# Patient Record
Sex: Female | Born: 2000 | Race: Black or African American | Hispanic: No | Marital: Single | Smoking: Never smoker
Health system: Southern US, Community
[De-identification: ages and names within clinical notes are randomized; demographics above are authoritative.]

## PROBLEM LIST (undated history)

## (undated) DIAGNOSIS — F419 Anxiety disorder, unspecified: Secondary | ICD-10-CM

## (undated) DIAGNOSIS — R519 Headache, unspecified: Secondary | ICD-10-CM

## (undated) DIAGNOSIS — J45909 Unspecified asthma, uncomplicated: Secondary | ICD-10-CM

## (undated) DIAGNOSIS — K649 Unspecified hemorrhoids: Secondary | ICD-10-CM

## (undated) HISTORY — DX: Unspecified hemorrhoids: K64.9

## (undated) HISTORY — DX: Unspecified asthma, uncomplicated: J45.909

---
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
## 2019-01-13 ENCOUNTER — Encounter: Payer: BC Managed Care – PPO | Admitting: Obstetrics and Gynecology

## 2019-01-14 ENCOUNTER — Telehealth: Payer: Self-pay

## 2019-01-14 NOTE — Telephone Encounter (Signed)
tching vaginal maybe from wearing pad every day for three months due to non stop bleeding since receiving nexplanon 09/2018. Given appt 12/23 at 11:00 [place]. Please advise if yeast meds refill for itching or recommendations.

## 2019-01-15 ENCOUNTER — Telehealth: Payer: Self-pay

## 2019-01-15 ENCOUNTER — Other Ambulatory Visit: Payer: Self-pay | Admitting: Certified Nurse Midwife

## 2019-01-15 MED ORDER — FLUCONAZOLE 150 MG PO TABS: 150.0000 mg | ORAL_TABLET | Freq: Once | ORAL | 1 refills | Status: DC

## 2019-01-15 MED ORDER — METRONIDAZOLE 500 MG PO TABS: 500.0000 mg | ORAL_TABLET | Freq: Two times a day (BID) | ORAL | 0 refills | Status: AC

## 2019-01-15 MED ORDER — METRONIDAZOLE 500 MG PO TABS: 500.0000 mg | ORAL_TABLET | Freq: Two times a day (BID) | ORAL | 0 refills | Status: DC

## 2019-01-15 MED ORDER — FLUCONAZOLE 150 MG PO TABS: 150.0000 mg | ORAL_TABLET | Freq: Once | ORAL | 1 refills | Status: AC

## 2019-01-15 NOTE — Telephone Encounter (Signed)
She does not need an appointment for the itching? I will send her in meds for BV and yeast. Very common to get vaginal infection with bleeding. I will put order in . Please let her know    Thanks  Deneise Lever

## 2019-01-15 NOTE — Telephone Encounter (Signed)
Informed patient of refill sent by AT. Patient states she moved to [place] and please send to [facility] on [address] which was done

## 2019-01-15 NOTE — Progress Notes (Signed)
Orders placed for BV and yeast. Pt has has bleeding for a few months with nexplanon placement and is experiencing itching/burning.   Philip Aspen, CNM

## 2019-02-03 ENCOUNTER — Encounter: Payer: Self-pay | Admitting: Certified Nurse Midwife

## 2019-02-03 ENCOUNTER — Telehealth: Payer: Self-pay | Admitting: Certified Nurse Midwife

## 2019-02-03 ENCOUNTER — Encounter: Payer: BC Managed Care – PPO | Admitting: Certified Nurse Midwife

## 2019-02-03 ENCOUNTER — Ambulatory Visit: Payer: BC Managed Care – PPO | Admitting: Certified Nurse Midwife

## 2019-02-03 ENCOUNTER — Other Ambulatory Visit: Payer: Self-pay

## 2019-02-03 VITALS — BP 114/66 | HR 64 | Ht 69.0 in | Wt 123.3 lb

## 2019-02-03 DIAGNOSIS — N939 Abnormal uterine and vaginal bleeding, unspecified: Secondary | ICD-10-CM

## 2019-02-03 MED ORDER — TRANEXAMIC ACID 650 MG PO TABS: 1300.0000 mg | ORAL_TABLET | Freq: Three times a day (TID) | ORAL | 0 refills | Status: AC

## 2019-02-03 NOTE — Progress Notes (Signed)
GYN ENCOUNTER NOTE  Subjective:       Shelley Willis is a 18 y.o. G0P0000 female is here for gynecologic evaluation of the following issues:  1. Bleeding since September, She has the nexplanon for birth control. She has not tried motrin.    Gynecologic History No LMP recorded. Patient has had an implant. Contraception: Nexplanon Last Pap: N/A  mammogram: N/A  Obstetric History OB History  Gravida Para Term Preterm AB Living  0 0 0 0 0 0  SAB TAB Ectopic Multiple Live Births  0 0 0 0 0    Past Medical History:  Diagnosis Date  . Asthma     History reviewed. No pertinent surgical history.  Current Outpatient Medications on File Prior to Visit  Medication Sig Dispense Refill  . acetaminophen (TYLENOL) 325 MG tablet Take 325 mg by mouth as needed.     . Acetaminophen-Caff-Pyrilamine (MIDOL MAX ST MENSTRUAL) 500-60-15 MG TABS Take 2 tablets by mouth as needed.    . cetirizine (ZYRTEC) 10 MG tablet Take 10 mg by mouth as needed for allergies.     No current facility-administered medications on file prior to visit.    No Known Allergies  Social History   Socioeconomic History  . Marital status: Single    Spouse name: Not on file  . Number of children: Not on file  . Years of education: Not on file  . Highest education level: Not on file  Occupational History  . Not on file  Tobacco Use  . Smoking status: Never Smoker  . Smokeless tobacco: Never Used  Substance and Sexual Activity  . Alcohol use: Yes    Comment: occasional   . Drug use: Yes    Types: Marijuana  . Sexual activity: Yes    Birth control/protection: Condom  Other Topics Concern  . Not on file  Social History Narrative  . Not on file   Social Determinants of Health   Financial Resource Strain:   . Difficulty of Paying Living Expenses: Not on file  Food Insecurity:   . Worried About Charity fundraiser in the Last Year: Not on file  . Ran Out of Food in the Last Year: Not on file   Transportation Needs:   . Lack of Transportation (Medical): Not on file  . Lack of Transportation (Non-Medical): Not on file  Physical Activity:   . Days of Exercise per Week: Not on file  . Minutes of Exercise per Session: Not on file  Stress:   . Feeling of Stress : Not on file  Social Connections:   . Frequency of Communication with Friends and Family: Not on file  . Frequency of Social Gatherings with Friends and Family: Not on file  . Attends Religious Services: Not on file  . Active Member of Clubs or Organizations: Not on file  . Attends Archivist Meetings: Not on file  . Marital Status: Not on file  Intimate Partner Violence:   . Fear of Current or Ex-Partner: Not on file  . Emotionally Abused: Not on file  . Physically Abused: Not on file  . Sexually Abused: Not on file    Family History  Problem Relation Age of Onset  . Breast cancer Neg Hx   . Ovarian cancer Neg Hx   . Colon cancer Neg Hx     The following portions of the patient's history were reviewed and updated as appropriate: allergies, current medications, past family history, past medical history, past social  history, past surgical history and problem list.  Review of Systems Review of Systems - Negative except as mentioned in HPI Review of Systems - General ROS: negative for - chills, fatigue, fever, hot flashes, malaise or night sweats Hematological and Lymphatic ROS: negative for - bleeding problems or swollen lymph nodes Gastrointestinal ROS: negative for - abdominal pain, blood in stools, change in bowel habits and nausea/vomiting Musculoskeletal ROS: negative for - joint pain, muscle pain or muscular weakness Genito-Urinary ROS: negative for - , dysmenorrhea, dyspareunia, dysuria, genital discharge, genital ulcers, hematuria, incontinence, irregular/heavy menses, nocturia or pelvic pain. Positive for menstrual cycle change  Objective:   BP 114/66   Pulse 64   Ht 5\' 9"  (1.753 m)   Wt 123  lb 5 oz (55.9 kg)   BMI 18.21 kg/m  CONSTITUTIONAL: Well-developed, well-nourished female in no acute distress.  HENT:  Normocephalic, atraumatic.  NECK: Normal range of motion, supple, no masses.  Normal thyroid.  SKIN: Skin is warm and dry. No rash noted. Not diaphoretic. No erythema. No pallor. [place]: Alert and oriented to person, place, and time. PSYCHIATRIC: Normal mood and affect. Normal behavior. Normal judgment and thought content. CARDIOVASCULAR:Not Examined RESPIRATORY: Not Examined BREASTS: Not Examined ABDOMEN: Soft, non distended; Non tender.  No Organomegaly. PELVIC:not indicated  MUSCULOSKELETAL: Normal range of motion. No tenderness.  No cyanosis, clubbing, or edema.  Assessment:   Abnormal uterine bleeding with Nexplanon   Plan:  Reviewed bleeding profile with Nexplanon, discussed use of motrin to thin lining of uterus and help bleeding, discussed use on pill x 3 months, or use of Lysteda . Pt request to use lysteda. She denies any contraindications to use. Orders placed.   I attest more than 50% of visit spent reviewing history, discussing plan of care , answering questions. Face to face time 10 min.  Philip Aspen, CNM

## 2019-02-11 NOTE — Telephone Encounter (Signed)
Pt was advised to try taking the medication with applesauce or yogurt. Pt stated that she has a really hard time swallowing pills and the pill dissolved before she get the medication down. Pt was advised to try to cut the medication in half and take 2 halves instead of one whole pill to see if that would work. Pt stated that she would try that.

## 2019-02-11 NOTE — Telephone Encounter (Signed)
Pt called in stated that the pills that was given to her at her last appt are hard to swallow  and she is letting the desovle. Pt is wanting to know is that okay or cant she have something smaller.

## 2019-02-15 ENCOUNTER — Telehealth: Payer: Self-pay | Admitting: Certified Nurse Midwife

## 2019-02-15 NOTE — Telephone Encounter (Signed)
Pt called in stating that she has stopped taking the pills that stop  Bleeding. Pt is requesting a different pill to take. please advise

## 2019-02-16 ENCOUNTER — Telehealth: Payer: Self-pay

## 2019-02-16 NOTE — Telephone Encounter (Signed)
Spoke with patient to get more information about the message that was received from her. Patient states she only took 1 Lysteda because she can't swallow pills despite trying yogurt,ice cream and applesauce. She states the pill dissolves in her mouth before she can get it down. She does not wish to try the OCPs  For the same reason.

## 2019-02-16 NOTE — Telephone Encounter (Signed)
Ivin Booty can you please follow up with this pt. She has in a nexplanon. From my last note it looks like she tried the lysteda. She called stating "that she has stopped taking the pills that stop bleeding , she is requesting a different pill ."  So need to clarify if she took lysteda did it stop the bleeding? If not we talked about trying BC pill x 3 months or does she want to change BC from nexplanon to pill?  Thanks  Deneise Lever

## 2019-02-17 ENCOUNTER — Telehealth: Payer: Self-pay

## 2019-02-17 NOTE — Telephone Encounter (Signed)
Patient missed the providers phone call. Would like a phone call. Patient says vmail was cut off

## 2019-02-17 NOTE — Telephone Encounter (Signed)
Voicemail message left for patient- per AT she may wait it out with the Nexplanon, or change to an IUD, a NuvaRing or the depo inj. Requested patient let us know what she decides to do.

## 2019-02-18 ENCOUNTER — Telehealth: Payer: Self-pay

## 2019-02-18 NOTE — Telephone Encounter (Signed)
Spoke with patient to give her her options for different birth control. After she was given the options she was asked if any of them sounded like something she was interested in. She chuckled and said that's not why I called. I reminded her of our conversation about her not being able to swallow pills. She wanted to know what the other pill was AT mentioned. She said she thought it was motrin. That is an option she can try to stop the bleeding which she said was not bad today. She was encouraged to reach back out to [place] if she had any further questions.

## 2019-03-04 ENCOUNTER — Other Ambulatory Visit: Payer: Self-pay | Admitting: Certified Nurse Midwife

## 2019-03-04 DIAGNOSIS — N6320 Unspecified lump in the left breast, unspecified quadrant: Secondary | ICD-10-CM

## 2019-03-09 ENCOUNTER — Other Ambulatory Visit: Payer: Self-pay

## 2019-03-09 DIAGNOSIS — N63 Unspecified lump in unspecified breast: Secondary | ICD-10-CM

## 2019-03-16 ENCOUNTER — Other Ambulatory Visit: Payer: Self-pay | Admitting: Obstetrics and Gynecology

## 2019-03-16 DIAGNOSIS — N63 Unspecified lump in unspecified breast: Secondary | ICD-10-CM

## 2019-03-18 ENCOUNTER — Telehealth: Payer: Self-pay

## 2019-03-18 NOTE — Telephone Encounter (Signed)
Patient mom is upset that her daughters referral to the breast center isn't complete. Breast ctr says there are no active orders showing in the system. Please call the [facility]

## 2019-03-20 NOTE — Telephone Encounter (Signed)
Ok. Will do

## 2019-03-24 NOTE — Telephone Encounter (Signed)
Called patient to make her aware that order has been E-signed.  No answer, LMTRC.

## 2019-04-01 ENCOUNTER — Encounter: Payer: BC Managed Care – PPO | Admitting: Certified Nurse Midwife

## 2019-04-16 ENCOUNTER — Ambulatory Visit
Admission: RE | Admit: 2019-04-16 | Discharge: 2019-04-16 | Disposition: A | Discharge status: Home / Self Care | Payer: BC Managed Care – PPO | Source: Ambulatory Visit | Attending: Obstetrics and Gynecology | Admitting: Obstetrics and Gynecology

## 2019-04-16 DIAGNOSIS — N63 Unspecified lump in unspecified breast: Secondary | ICD-10-CM | POA: Insufficient documentation

## 2019-04-18 NOTE — Progress Notes (Signed)
For some reason, you are listed as her PCP. JML

## 2019-04-19 ENCOUNTER — Other Ambulatory Visit: Payer: Self-pay | Admitting: Obstetrics and Gynecology

## 2019-04-19 ENCOUNTER — Telehealth: Payer: Self-pay | Admitting: Certified Nurse Midwife

## 2019-04-19 DIAGNOSIS — N6320 Unspecified lump in the left breast, unspecified quadrant: Secondary | ICD-10-CM

## 2019-04-19 DIAGNOSIS — R928 Other abnormal and inconclusive findings on diagnostic imaging of breast: Secondary | ICD-10-CM

## 2019-04-19 NOTE — Telephone Encounter (Signed)
Mother requesting 04/16/2019 breast imaging results for patient and wondering what will be next?  Please advise and let me know if I need to do anything.  Thanks. Jennye Moccasin

## 2019-04-19 NOTE — Telephone Encounter (Signed)
Check and make sure mother is able to obtain patient information. Report is below. Follow up as indicated. Activate MyChart for direct line to information. JML   CLINICAL DATA:  Short-term interval follow-up of a left breast mass. The patient states that the left breast mass is clinically enlarging.  EXAM: ULTRASOUND OF THE LEFT BREAST  COMPARISON:  Previous exam(s).  FINDINGS: On physical exam, I palpate a discrete mass in left breast at 1 o'clock 2 cm from the nipple.  Targeted ultrasound is performed, showing a well-circumscribed hypoechoic mass in the left breast at 1 o'clock 2 cm from the nipple measuring 3.5 x 1.8 x 3.8 cm. On the prior ultrasound dated 10/08/2018 it measured 2.8 x 1.5 x 2.8 cm. Sonographic evaluation the left axilla does not show any enlarged adenopathy.  IMPRESSION: Indeterminate left breast mass.  RECOMMENDATION: Ultrasound-guided core biopsy of the mass in the 1 o'clock region of the left breast is recommended.  I have discussed the findings and recommendations with the patient. If applicable, a reminder letter will be sent to the patient regarding the next appointment.  BI-RADS CATEGORY  4: Suspicious.

## 2019-04-19 NOTE — Telephone Encounter (Signed)
Patients mother called wanting to follow up with her daughters test results. Wants to touch base on the next steps.

## 2019-04-20 NOTE — Telephone Encounter (Signed)
Attempted to contact patient.  No answer, LMTRC.

## 2019-04-21 NOTE — Telephone Encounter (Signed)
Attempted to contact patient.  No answer, LMTRC.

## 2019-04-23 ENCOUNTER — Telehealth: Payer: Self-pay | Admitting: Obstetrics and Gynecology

## 2019-04-23 NOTE — Telephone Encounter (Signed)
Pt was trying to talk to a nurse. The pt is requesting a call back. Please advise

## 2019-04-23 NOTE — Telephone Encounter (Signed)
Patient called about her biopsy. Wants to get it removed. Told patient to call back in 10-20 minutes when nurse gets back from lunch.

## 2019-04-23 NOTE — Telephone Encounter (Signed)
Attempted to contact patient x3.  Chart filed.

## 2019-04-23 NOTE — Telephone Encounter (Signed)
Called and spoke with patient.  Patient requesting referral to breast surgeon.  Please advise.

## 2019-04-24 ENCOUNTER — Other Ambulatory Visit (INDEPENDENT_AMBULATORY_CARE_PROVIDER_SITE_OTHER): Payer: BC Managed Care – PPO | Admitting: Certified Nurse Midwife

## 2019-04-24 DIAGNOSIS — N6321 Unspecified lump in the left breast, upper outer quadrant: Secondary | ICD-10-CM

## 2019-04-24 NOTE — Progress Notes (Signed)
Referral to General Surgery per patient request.    Shelley Willis, CNM [facility], [facility] 04/24/19 2:57 PM

## 2019-04-24 NOTE — Telephone Encounter (Signed)
Please contact patient. Referral placed to general surgery. Encouraged patient to activate MyChart. Thanks, JML

## 2019-04-28 NOTE — Telephone Encounter (Signed)
Called patient, no answer.  [place].

## 2019-04-29 NOTE — Telephone Encounter (Signed)
Called patient, no answer.  [place].

## 2019-04-30 ENCOUNTER — Encounter: Payer: Self-pay | Admitting: Surgery

## 2019-04-30 ENCOUNTER — Ambulatory Visit: Payer: BC Managed Care – PPO | Admitting: Surgery

## 2019-04-30 ENCOUNTER — Other Ambulatory Visit: Payer: Self-pay

## 2019-04-30 VITALS — BP 100/65 | HR 79 | Temp 98.1°F | Resp 14 | Ht 69.0 in | Wt 122.0 lb

## 2019-04-30 DIAGNOSIS — N6321 Unspecified lump in the left breast, upper outer quadrant: Secondary | ICD-10-CM

## 2019-04-30 NOTE — Patient Instructions (Addendum)
We have sent a message to the [facility] letting them know to contact you to schedule your breast biopsy. The will contact you about this. If you do not hear from them by Wednesday please let us know. We will schedule you for a follow up here with Dr Hampton Abbot to discuss the results and further care once we know the date for your biopsy.    Breast Biopsy A breast biopsy is a procedure in which a sample of breast tissue is removed from the breast and examined under a microscope to see if cancerous cells are present. You may need a breast biopsy if you have:  Any undiagnosed breast mass (tumor).  Nipple abnormalities, dimpling, crusting, or ulcerations.  Abnormal discharge from the nipple, especially blood.  Redness, swelling, and pain of the breast.  Calcium deposits (calcifications) or abnormalities seen on a mammogram, ultrasound results, or MRI results.  Abnormal changes in the breast seen on your mammogram. If the breast abnormality is found to be cancerous (malignant), a breast biopsy can help to determine what the best treatment is for you. There are many different types of breast biopsies. Talk with your health care provider about your options and which type is best for you. Tell a health care provider about:  Any allergies you have.  All medicines you are taking, including vitamins, herbs, eye drops, creams, and over-the-counter medicines.  Any problems you or family members have had with anesthetic medicines.  Any blood disorders you have.  Any surgeries you have had.  Any medical conditions you have.  Whether you are pregnant or may be pregnant. What are the risks? Generally, this is a safe procedure. However, problems may occur, including:  Discomfort. This is temporary.  Bruising and swelling of the breast.  Changes in the shape of the breast.  Bleeding.  Infection.  Damage to other tissues.  Allergic reactions to medicines.  Needing more  surgery. What happens before the procedure? Medicines Ask your health care provider about:  Changing or stopping your regular medicines. This is especially important if you are taking diabetes medicines or blood thinners.  Taking medicines such as aspirin and ibuprofen. These medicines can thin your blood. Do not take these medicines unless your health care provider tells you to take them.  Taking over-the-counter medicines, vitamins, herbs, and supplements. Lifestyle  Do not use any products that contain nicotine or tobacco, such as cigarettes, e-cigarettes, and chewing tobacco. If you need help quitting, ask your health care provider.  Do not drink alcohol for 24 hours before the procedure.  Wear a good support bra to the procedure. Eating and drinking restrictions Talk to your health care provider about when you should stop eating and drinking.  You may be asked not to drink or eat for 2-8 hours before the breast biopsy.  In some cases, you may be allowed to eat a light breakfast. General instructions  Plan to have someone take you home from the hospital or clinic.  Ask your health care provider how your surgical site will be marked or identified.  Ask your health care provider what steps will be taken to help prevent infection. These may include: ? Removing hair at the surgery site. ? Washing skin with a germ-killing soap.  Your health care provider may do a procedure to locate and mark the tumor area in your breast (localization). This will help guide your surgeon to where the biopsy or incision is made. This may be done with: ?  Imaging, such as a mammogram, ultrasound, or MRI. ? Insertion of special wire, clip, seed, or radar reflector implant in the tumor area. What happens during the procedure?   You may be given one or more of the following: ? A medicine to numb the breast area (local anesthetic). ? A medicine to help you relax (sedative). ? A medicine to make you  fall asleep (general anesthetic).  Your health care provider will perform the biopsy using only one of the following methods. He or she will do: ? Fine needle aspiration. A thin needle with a syringe will be inserted into a breast cyst. Fluid and cells will be removed. ? Core needle biopsy. A wide, hollow needle (core needle) will be inserted into a breast lump multiple times to remove tissue samples or cores. ? Stereotactic biopsy. X-rays and a computer will be used to locate the breast lump. The surgeon will use the X-ray images to collect several samples of tissue using a needle. ? Vacuum-assisted biopsy. A small incision will be made in your breast. A hollow needle and vacuum will be passed through the incision and into the breast tissue. The vacuum will gently draw abnormal breast tissue into the needle to remove it. ? Ultrasound-guided core needle biopsy. An ultrasound will be used to help guide the core needle to the area of the mass or abnormality. An incision will be made to insert the needle. Then tissue samples will be removed. ? Surgical biopsy. An incision will be made in the breast to remove part or all of the abnormal tissue. After the tissue is removed, the skin over the area will be closed with sutures and covered with a dressing. There are two types of surgical biopsies:  Incisional biopsy. The surgeon will remove part of the breast lump.  Excisional biopsy. The surgeon will attempt to remove the whole breast lump or as much of it as possible. After any of these procedures, the tissue or fluid that was removed will be examined under a microscope. The procedure may vary among health care providers and hospitals. What happens after the procedure?  You will be taken to the recovery area. If you are doing well and have no problems, you will be allowed to go home.  You may have a pressure dressing applied on your breast for 24-48 hours. You may also be advised to wear a supportive bra  during this time.  Do not drive for 24 hours if you were given a sedative during your procedure. Summary  A breast biopsy is a procedure in which a sample of breast tissue is removed from the breast and examined under a microscope to see if cancerous cells are present.  This is a safe procedure, but problems can occur, including bleeding, infection, pain, and bruising.  Ask your health care provider about changing or stopping your regular medicines.  Plan to have someone take you home from the hospital or clinic. This information is not intended to replace advice given to you by your health care provider. Make sure you discuss any questions you have with your health care provider. Document Revised: 07/16/2017 Document Reviewed: 07/16/2017 Elsevier Patient Education  [place].

## 2019-04-30 NOTE — Telephone Encounter (Signed)
Called and spoke with patient.  Patient stated she had an appointment with the surgeon today and is planning on proceeding with breast biopsy.  Encouraged to activate MyChart and patient planning on setting it up.

## 2019-04-30 NOTE — Progress Notes (Signed)
04/30/2019  Reason for Visit:  Left breast mass  Referring Provider:  Rubie Maid, MD  History of Present Illness: Shelley Willis is a 19 y.o. female presenting for evaluation of a left breast mass.  The patient first noticed it about a year ago.  She had an ultrasound on 10/08/18 which showed a hypoechoic oval mass measuring 2.8 x 1.5 x 2.8 cm, and labeled as BIRADS 3 probably benign.  She had a follow up ultrasound on 04/16/19 which showed that the mass had grown in size measuring 3.5 x 1.8 x 3.8 cm.  It was labeled BIRADS 4 suspicious.  Biopsy was recommended.  The patient presents today because she's wondering about proceeding directly with excision instead of biopsy.  Patient reports that she has noticed the mass growing over the past two months or so.  She also notices discomfort on the left breast, particularly if she sleeps on her left side or she palpates the area.  Denies any skin changes or nipple changes.  Also denies any issues on the right breast.  Past Medical History: Past Medical History:  Diagnosis Date  . Asthma   . Hemorrhoids      Past Surgical History: --None  Home Medications: Prior to Admission medications   Medication Sig Start Date End Date Taking? Authorizing Provider  Acetaminophen-Caff-Pyrilamine (MIDOL MAX ST MENSTRUAL) 500-60-15 MG TABS Take 2 tablets by mouth as needed.   Yes [provider]  cetirizine (ZYRTEC) 10 MG tablet Take 10 mg by mouth as needed for allergies.   Yes [provider]  etonogestrel (NEXPLANON) 68 MG IMPL implant 1 each by Subdermal route once.   Yes [provider]    Allergies: No Known Allergies  Social History:  reports that she has never smoked. She has never used smokeless tobacco. She reports current alcohol use. She reports current drug use. Drug: Marijuana.   Family History: Family History  Problem Relation Age of Onset  . Fibrocystic breast disease Mother   . Breast cancer Neg Hx   . Ovarian  cancer Neg Hx   . Colon cancer Neg Hx     Review of Systems: Review of Systems  Constitutional: Negative for chills and fever.  HENT: Negative for hearing loss.   Respiratory: Negative for shortness of breath.   Cardiovascular: Negative for chest pain.  Gastrointestinal: Negative for abdominal pain, nausea and vomiting.  Genitourinary: Negative for dysuria.  Musculoskeletal: Negative for myalgias.  Skin: Negative for rash.  Neurological: Negative for dizziness.  Psychiatric/Behavioral: Negative for depression.    Physical Exam BP 100/65   Pulse 79   Temp 98.1 F (36.7 C)   Resp 14   Ht 5\' 9"  (1.753 m)   Wt 122 lb (55.3 kg)   LMP 04/28/2019   SpO2 98%   BMI 18.02 kg/m  CONSTITUTIONAL: No acute distress HEENT:  Normocephalic, atraumatic, extraocular motion intact. NECK: Trachea is midline, and there is no jugular venous distension.  RESPIRATORY:  Lungs are clear, and breath sounds are equal bilaterally. Normal respiratory effort without pathologic use of accessory muscles. CARDIOVASCULAR: Heart is regular without murmurs, gallops, or rubs. BREAST:  Left breast with a palpable mass at 1-2 o clock position, 2-3 cm from nipple.  It is mobile, well circumscribed, but does have some discomfort with palpation.  Otherwise no other palpable masses, no skin changes, and no nipple changes.  No left axillary or supraclavicular lymphadenopathy.  Right breast without any palpable masses, skin changes, or nipple changes.  No  right axillary or supraclavicular lymphadenopathy. GI: The abdomen is soft, non-distended, non-tender.  MUSCULOSKELETAL:  Normal muscle strength and tone in all four extremities.  No peripheral edema or cyanosis. SKIN: Skin turgor is normal. There are no pathologic skin lesions.  NEUROLOGIC:  Motor and sensation is grossly normal.  Cranial nerves are grossly intact. PSYCH:  Alert and oriented to person, place and time. Affect is normal.  Laboratory Analysis: No results  found for this or any previous visit (from the past 24 hour(s)).  Imaging: U/S 10/08/18: FINDINGS: On physical exam, a palpable lump is felt in the left breast at 1 o'clock.  Targeted ultrasound is performed, showing a mildly hypoechoic oval mass with parallel orientation and increased through transmission measuring 2.8 x 1.5 by 2.8 cm. No axillary adenopathy.  IMPRESSION: Probably benign left breast mass. The patient states the mass has not significantly changed in size by palpation since March 2020.  RECOMMENDATION: Recommend six-month follow-up ultrasound of the probably benign left breast mass.  I have discussed the findings and recommendations with the patient. Results were also provided in writing at the conclusion of the visit. If applicable, a reminder letter will be sent to the patient regarding the next appointment.  BI-RADS CATEGORY  3: Probably benign.   U/S 04/16/19: FINDINGS: On physical exam, I palpate a discrete mass in left breast at 1 o'clock 2 cm from the nipple.  Targeted ultrasound is performed, showing a well-circumscribed hypoechoic mass in the left breast at 1 o'clock 2 cm from the nipple measuring 3.5 x 1.8 x 3.8 cm. On the prior ultrasound dated 10/08/2018 it measured 2.8 x 1.5 x 2.8 cm. Sonographic evaluation the left axilla does not show any enlarged adenopathy.  IMPRESSION: Indeterminate left breast mass.  RECOMMENDATION: Ultrasound-guided core biopsy of the mass in the 1 o'clock region of the left breast is recommended.  I have discussed the findings and recommendations with the patient. If applicable, a reminder letter will be sent to the patient regarding the next appointment.  BI-RADS CATEGORY  4: Suspicious.  Assessment and Plan: This is a 19 y.o. female with an enlarging left breast mass.  Discussed with the patient that given the enlargement of the mass, and now labeling of BIRADS 4, I would agree with obtaining biopsy of  the mass prior to surgical excision.  This would be so that we have a diagnosis prior to surgery.  In the less likely scenario that this is malignant, the surgical plan would change as lymph nodes would need to be sampled.  I think this would be better to all do in one surgery rather than two different surgical settings if we excise the mass only first.  They are in agreement with this plan. Will contact [facility] so they can schedule her biopsy.  She will follow up with me afterwards to discuss results and to schedule surgery.  Face-to-face time spent with the patient and care providers was 60 minutes, with more than 50% of the time spent counseling, educating, and coordinating care of the patient.     Melvyn Neth, [facility]

## 2019-05-04 ENCOUNTER — Other Ambulatory Visit: Payer: Self-pay | Admitting: Surgery

## 2019-05-04 DIAGNOSIS — R928 Other abnormal and inconclusive findings on diagnostic imaging of breast: Secondary | ICD-10-CM

## 2019-05-04 DIAGNOSIS — N6320 Unspecified lump in the left breast, unspecified quadrant: Secondary | ICD-10-CM

## 2019-05-05 ENCOUNTER — Telehealth: Payer: Self-pay

## 2019-05-05 NOTE — Telephone Encounter (Signed)
Message left for the patient to call the office back. She is scheduled for a breast biopsy at [facility] on 05/11/19. She needs to schedule a follow up appointment with Dr Hampton Abbot for the following week to see him for results and to go over her next steps.

## 2019-05-11 ENCOUNTER — Ambulatory Visit
Admission: RE | Admit: 2019-05-11 | Discharge: 2019-05-11 | Disposition: A | Discharge status: Home / Self Care | Payer: BC Managed Care – PPO | Source: Ambulatory Visit | Attending: Obstetrics and Gynecology | Admitting: Obstetrics and Gynecology

## 2019-05-11 ENCOUNTER — Ambulatory Visit
Admission: RE | Admit: 2019-05-11 | Discharge: 2019-05-11 | Disposition: A | Discharge status: Home / Self Care | Payer: BC Managed Care – PPO | Source: Ambulatory Visit | Attending: Surgery | Admitting: Surgery

## 2019-05-11 DIAGNOSIS — R928 Other abnormal and inconclusive findings on diagnostic imaging of breast: Secondary | ICD-10-CM | POA: Insufficient documentation

## 2019-05-11 DIAGNOSIS — N6320 Unspecified lump in the left breast, unspecified quadrant: Secondary | ICD-10-CM

## 2019-05-12 LAB — SURGICAL PATHOLOGY

## 2019-05-20 ENCOUNTER — Encounter: Payer: BC Managed Care – PPO | Admitting: Certified Nurse Midwife

## 2019-05-21 ENCOUNTER — Ambulatory Visit: Payer: BC Managed Care – PPO | Admitting: Surgery

## 2019-06-12 HISTORY — PX: BREAST BIOPSY: SHX20

## 2019-06-17 ENCOUNTER — Ambulatory Visit (INDEPENDENT_AMBULATORY_CARE_PROVIDER_SITE_OTHER): Payer: BC Managed Care – PPO | Admitting: Certified Nurse Midwife

## 2019-06-17 ENCOUNTER — Other Ambulatory Visit (HOSPITAL_COMMUNITY)
Admission: RE | Admit: 2019-06-17 | Discharge: 2019-06-17 | Disposition: A | Discharge status: Home / Self Care | Payer: BC Managed Care – PPO | Source: Ambulatory Visit | Attending: Certified Nurse Midwife | Admitting: Certified Nurse Midwife

## 2019-06-17 ENCOUNTER — Encounter: Payer: Self-pay | Admitting: Certified Nurse Midwife

## 2019-06-17 ENCOUNTER — Other Ambulatory Visit: Payer: Self-pay

## 2019-06-17 VITALS — BP 106/61 | HR 71 | Ht 69.0 in | Wt 127.1 lb

## 2019-06-17 DIAGNOSIS — Z113 Encounter for screening for infections with a predominantly sexual mode of transmission: Secondary | ICD-10-CM | POA: Insufficient documentation

## 2019-06-17 DIAGNOSIS — Z01419 Encounter for gynecological examination (general) (routine) without abnormal findings: Secondary | ICD-10-CM | POA: Insufficient documentation

## 2019-06-17 DIAGNOSIS — Z975 Presence of (intrauterine) contraceptive device: Secondary | ICD-10-CM

## 2019-06-17 DIAGNOSIS — N6321 Unspecified lump in the left breast, upper outer quadrant: Secondary | ICD-10-CM | POA: Diagnosis not present

## 2019-06-17 NOTE — Patient Instructions (Signed)
Preventive Care 18-21 Years Old, Female Preventive care refers to lifestyle choices and visits with your health care provider that can promote health and wellness. At this stage in your life, you may start seeing a primary care physician instead of a pediatrician. Your health care is now your responsibility. Preventive care for young adults includes:  A yearly physical exam. This is also called an annual wellness visit.  Regular dental and eye exams.  Immunizations.  Screening for certain conditions.  Healthy lifestyle choices, such as diet and exercise. What can I expect for my preventive care visit? Physical exam Your health care provider may check:  Height and weight. These may be used to calculate body mass index (BMI), which is a measurement that tells if you are at a healthy weight.  Heart rate and blood pressure.  Body temperature. Counseling Your health care provider may ask you questions about:  Past medical problems and family medical history.  Alcohol, tobacco, and drug use.  Home and relationship well-being.  Access to firearms.  Emotional well-being.  Diet, exercise, and sleep habits.  Sexual activity and sexual health.  Method of birth control.  Menstrual cycle.  Pregnancy history. What immunizations do I need?  Influenza (flu) vaccine  This is recommended every year. Tetanus, diphtheria, and pertussis (Tdap) vaccine  You may need a Td booster every 10 years. Varicella (chickenpox) vaccine  You may need this vaccine if you have not already been vaccinated. Human papillomavirus (HPV) vaccine  If recommended by your health care provider, you may need three doses over 6 months. Measles, mumps, and rubella (MMR) vaccine  You may need at least one dose of MMR. You may also need a second dose. Meningococcal conjugate (MenACWY) vaccine  One dose is recommended if you are 19-21 years old and a first-year college student living in a residence hall,  or if you have one of several medical conditions. You may also need additional booster doses. Pneumococcal conjugate (PCV13) vaccine  You may need this if you have certain conditions and were not previously vaccinated. Pneumococcal polysaccharide (PPSV23) vaccine  You may need one or two doses if you smoke cigarettes or if you have certain conditions. Hepatitis A vaccine  You may need this if you have certain conditions or if you travel or work in places where you may be exposed to hepatitis A. Hepatitis B vaccine  You may need this if you have certain conditions or if you travel or work in places where you may be exposed to hepatitis B. Haemophilus influenzae type b (Hib) vaccine  You may need this if you have certain risk factors. You may receive vaccines as individual doses or as more than one vaccine together in one shot (combination vaccines). Talk with your health care provider about the risks and benefits of combination vaccines. What tests do I need? Blood tests  Lipid and cholesterol levels. These may be checked every 5 years starting at age 20.  Hepatitis C test.  Hepatitis B test. Screening  Pelvic exam and Pap test. This may be done every 3 years starting at age 21.  Sexually transmitted disease (STD) testing, if you are at risk.  BRCA-related cancer screening. This may be done if you have a family history of breast, ovarian, tubal, or peritoneal cancers. Other tests  Tuberculosis skin test.  Vision and hearing tests.  Skin exam.  Breast exam. Follow these instructions at home: Eating and drinking   Eat a diet that includes fresh fruits and   vegetables, whole grains, lean protein, and low-fat dairy products.  Drink enough fluid to keep your urine pale yellow.  Do not drink alcohol if: ? Your health care provider tells you not to drink. ? You are pregnant, may be pregnant, or are planning to become pregnant. ? You are under the legal drinking age. In the  [place] the legal drinking age is 37.  If you drink alcohol: ? Limit how much you have to 0-1 drink a day. ? Be aware of how much alcohol is in your drink. In the [place] one drink equals one 12 oz bottle of beer (355 mL), one 5 oz glass of wine (148 mL), or one 1 oz glass of hard liquor (44 mL). Lifestyle  Take daily care of your teeth and gums.  Stay active. Exercise at least 30 minutes 5 or more days of the week.  Do not use any products that contain nicotine or tobacco, such as cigarettes, e-cigarettes, and chewing tobacco. If you need help quitting, ask your health care provider.  Do not use drugs.  If you are sexually active, practice safe sex. Use a condom or other form of birth control (contraception) in order to prevent pregnancy and STIs (sexually transmitted infections). If you plan to become pregnant, see your health care provider for a pre-conception visit.  Find healthy ways to cope with stress, such as: ? Meditation, yoga, or listening to music. ? Journaling. ? Talking to a trusted person. ? Spending time with friends and family. Safety  Always wear your seat belt while driving or riding in a vehicle.  Do not drive if you have been drinking alcohol. Do not ride with someone who has been drinking.  Do not drive when you are tired or distracted. Do not text while driving.  Wear a helmet and other protective equipment during sports activities.  If you have firearms in your house, make sure you follow all gun safety procedures.  Seek help if you have been bullied, physically abused, or sexually abused.  Use the Internet responsibly to avoid dangers such as online bullying and online sex predators. What's next?  Go to your health care provider once a year for a well check visit.  Ask your health care provider how often you should have your eyes and teeth checked.  Stay up to date on all vaccines. This information is not intended to replace advice given to you by  your health care provider. Make sure you discuss any questions you have with your health care provider. Document Revised: 01/22/2018 Document Reviewed: 01/22/2018 Elsevier Patient Education  2020 [place] implant What is this medicine? ETONOGESTREL (et oh noe JES trel) is a contraceptive (birth control) device. It is used to prevent pregnancy. It can be used for up to 3 years. This medicine may be used for other purposes; ask your health care provider or pharmacist if you have questions. COMMON BRAND NAME(S): Implanon, Nexplanon What should I tell my health care provider before I take this medicine? They need to know if you have any of these conditions:  abnormal vaginal bleeding  blood vessel disease or blood clots  breast, cervical, endometrial, ovarian, liver, or uterine cancer  diabetes  gallbladder disease  heart disease or recent heart attack  high blood pressure  high cholesterol or triglycerides  kidney disease  liver disease  migraine headaches  seizures  stroke  tobacco smoker  an unusual or allergic reaction to etonogestrel, anesthetics or antiseptics, other  medicines, foods, dyes, or preservatives  pregnant or trying to get pregnant  breast-feeding How should I use this medicine? This device is inserted just under the skin on the inner side of your upper arm by a health care professional. Talk to your pediatrician regarding the use of this medicine in children. Special care may be needed. Overdosage: If you think you have taken too much of this medicine contact a poison control center or emergency room at once. NOTE: This medicine is only for you. Do not share this medicine with others. What if I miss a dose? This does not apply. What may interact with this medicine? Do not take this medicine with any of the following medications:  amprenavir  fosamprenavir This medicine may also interact with the following  medications:  acitretin  aprepitant  armodafinil  bexarotene  bosentan  carbamazepine  certain medicines for fungal infections like fluconazole, ketoconazole, itraconazole and voriconazole  certain medicines to treat hepatitis, HIV or AIDS  cyclosporine  felbamate  griseofulvin  lamotrigine  modafinil  oxcarbazepine  phenobarbital  phenytoin  primidone  rifabutin  rifampin  rifapentine  St. John's wort  topiramate This list may not describe all possible interactions. Give your health care provider a list of all the medicines, herbs, non-prescription drugs, or dietary supplements you use. Also tell them if you smoke, drink alcohol, or use illegal drugs. Some items may interact with your medicine. What should I watch for while using this medicine? This product does not protect you against HIV infection (AIDS) or other sexually transmitted diseases. You should be able to feel the implant by pressing your fingertips over the skin where it was inserted. Contact your doctor if you cannot feel the implant, and use a non-hormonal birth control method (such as condoms) until your doctor confirms that the implant is in place. Contact your doctor if you think that the implant may have broken or become bent while in your arm. You will receive a user card from your health care provider after the implant is inserted. The card is a record of the location of the implant in your upper arm and when it should be removed. Keep this card with your health records. What side effects may I notice from receiving this medicine? Side effects that you should report to your doctor or health care professional as soon as possible:  allergic reactions like skin rash, itching or hives, swelling of the face, lips, or tongue  breast lumps, breast tissue changes, or discharge  breathing problems  changes in emotions or moods  coughing up blood  if you feel that the implant may have broken  or bent while in your arm  high blood pressure  pain, irritation, swelling, or bruising at the insertion site  scar at site of insertion  signs of infection at the insertion site such as fever, and skin redness, pain or discharge  signs and symptoms of a blood clot such as breathing problems; changes in vision; chest pain; severe, sudden headache; pain, swelling, warmth in the leg; trouble speaking; sudden numbness or weakness of the face, arm or leg  signs and symptoms of liver injury like dark yellow or brown urine; general ill feeling or flu-like symptoms; light-colored stools; loss of appetite; nausea; right upper belly pain; unusually weak or tired; yellowing of the eyes or skin  unusual vaginal bleeding, discharge Side effects that usually do not require medical attention (report to your doctor or health care professional if they continue or  are bothersome):  acne  breast pain or tenderness  headache  irregular menstrual bleeding  nausea This list may not describe all possible side effects. Call your doctor for medical advice about side effects. You may report side effects to FDA at 1-800-FDA-1088. Where should I keep my medicine? This drug is given in a hospital or clinic and will not be stored at home. NOTE: This sheet is a summary. It may not cover all possible information. If you have questions about this medicine, talk to your doctor, pharmacist, or health care provider.  2020 Elsevier/Gold Standard (2018-11-10 11:33:04)

## 2019-06-17 NOTE — Progress Notes (Signed)
ANNUAL PREVENTATIVE CARE GYN  ENCOUNTER NOTE  Subjective:       Shelley Willis is a 19 y.o. G0P0000 female here for a routine annual gynecologic exam.  Current complaints: 1.  Request STI testing  Reports irregular bleeding since Nexplanon placement.   Breast lump benign on biopsy 05/17/2019.  Denies difficulty breathing or respiratory distress, chest pain, abdominal pain, excessive vaginal bleeding, dysuria, and leg pain or swelling.     Gynecologic History  No LMP recorded (lmp unknown). Patient has had an implant. Period Duration (Days): 7-10 Period Pattern: (!) Irregular Menstrual Flow: Light, Moderate, Heavy Menstrual Control: Maxi pad, Tampon, Panty liner Dysmenorrhea: (!) Severe Dysmenorrhea Symptoms: Cramping  Contraception: Nexplanon  Last Pap: N/A  Obstetric History  OB History  Gravida Para Term Preterm AB Living  0 0 0 0 0 0  SAB TAB Ectopic Multiple Live Births  0 0 0 0 0  Obstetric Comments  Menstrual age: 19    Age 1st Pregnancy:N/A    Past Medical History:  Diagnosis Date  . Asthma   . Hemorrhoids     No past surgical history on file.  Current Outpatient Medications on File Prior to Visit  Medication Sig Dispense Refill  . cetirizine (ZYRTEC) 10 MG tablet Take 10 mg by mouth as needed for allergies.    Marland Kitchen etonogestrel (NEXPLANON) 68 MG IMPL implant 1 each by Subdermal route once.    . Acetaminophen-Caff-Pyrilamine (MIDOL MAX ST MENSTRUAL) 500-60-15 MG TABS Take 2 tablets by mouth as needed.     No current facility-administered medications on file prior to visit.    No Known Allergies  Social History   Socioeconomic History  . Marital status: Single    Spouse name: Not on file  . Number of children: Not on file  . Years of education: Not on file  . Highest education level: Not on file  Occupational History  . Not on file  Tobacco Use  . Smoking status: Never Smoker  . Smokeless tobacco: Never Used  Substance and Sexual Activity  .  Alcohol use: Yes    Comment: occasional   . Drug use: Yes    Types: Marijuana  . Sexual activity: Yes    Birth control/protection: Condom  Other Topics Concern  . Not on file  Social History Narrative  . Not on file   Social Determinants of Health   Financial Resource Strain:   . Difficulty of Paying Living Expenses:   Food Insecurity:   . Worried About Charity fundraiser in the Last Year:   . Arboriculturist in the Last Year:   Transportation Needs:   . Film/video editor (Medical):   Marland Kitchen Lack of Transportation (Non-Medical):   Physical Activity:   . Days of Exercise per Week:   . Minutes of Exercise per Session:   Stress:   . Feeling of Stress :   Social Connections:   . Frequency of Communication with Friends and Family:   . Frequency of Social Gatherings with Friends and Family:   . Attends Religious Services:   . Active Member of Clubs or Organizations:   . Attends Archivist Meetings:   Marland Kitchen Marital Status:   Intimate Partner Violence:   . Fear of Current or Ex-Partner:   . Emotionally Abused:   Marland Kitchen Physically Abused:   . Sexually Abused:     Family History  Problem Relation Age of Onset  . Fibrocystic breast disease Mother   . Breast  cancer Neg Hx   . Ovarian cancer Neg Hx   . Colon cancer Neg Hx     The following portions of the patient's history were reviewed and updated as appropriate: allergies, current medications, past family history, past medical history, past social history, past surgical history and problem list.  Review of Systems  ROS negative except as noted above. Information obtained from patient.    Objective:   BP 106/61   Pulse 71   Ht 5\' 9"  (1.753 m)   Wt 127 lb 2 oz (57.7 kg)   LMP  (LMP Unknown)   BMI 18.77 kg/m   CONSTITUTIONAL: Well-developed, well-nourished female in no acute distress.   PSYCHIATRIC: Normal mood and affect. Normal behavior. Normal judgment and thought content.  [place]: Alert and oriented to  person, place, and time. Normal muscle tone coordination. No cranial nerve deficit  noted.  HENT:  Normocephalic, atraumatic, External right and left ear normal. Oropharynx is clear and moist  EYES: Conjunctivae and EOM are normal. Pupils are equal, round, and reactive to light. No scleral icterus.   NECK: Normal range of motion, supple, no masses.  Normal thyroid.   SKIN: Skin is warm and dry. No rash noted. Not diaphoretic. No erythema. No pallor. Nexplanon present.   CARDIOVASCULAR: Normal heart rate noted, regular rhythm, no murmur.  RESPIRATORY: Clear to auscultation bilaterally. Effort and breath sounds normal, no problems with respiration noted.  BREASTS: Symmetric in size. No masses except already assessed left breast lump, skin changes, nipple drainage, or lymphadenopathy.  ABDOMEN: Soft, normal bowel sounds, no distention noted.  No tenderness, rebound or guarding.   PELVIC:  External Genitalia: Normal  Vagina: Normal, Swab collected   MUSCULOSKELETAL: Normal range of motion. No tenderness.  No cyanosis, clubbing, or edema.  2+ distal pulses.  LYMPHATIC: No Axillary, Supraclavicular, or Inguinal Adenopathy.  Assessment:   Annual gynecologic examination 19 y.o.   Contraception: condoms and Nexplanon   Normal BMI   Problem List Items Addressed This Visit      Other   Breast lump on left side at 1 o'clock position    Other Visit Diagnoses    Well woman exam    -  Primary   Relevant Orders   Cervicovaginal ancillary only   HIV Antibody (routine testing w rflx)   RPR   Screen for STD (sexually transmitted disease)       Relevant Orders   Cervicovaginal ancillary only   HIV Antibody (routine testing w rflx)   RPR   Nexplanon in place          Plan:   Pap: Not needed  Labs: See orders   Routine preventative health maintenance measures emphasized: Exercise/Diet/Weight control, Tobacco Warnings, Alcohol/Substance use risks, Stress Management, Peer  Pressure Issues and Safe Sex; see AVS  Discussed side effects of Nexplanon and symptoms management techniques  Reviewed red flag symptoms and when to call  Return to Clinic - 1 Year for ANNUAL EXAM and PAP or sooner if needed   Fransico Him, Loiza 06/17/19 [place], [place] [facility], [facility] 06/17/19 4:38 PM

## 2019-06-18 LAB — CERVICOVAGINAL ANCILLARY ONLY
Bacterial Vaginitis (gardnerella): NEGATIVE
Candida Glabrata: NEGATIVE
Candida Vaginitis: POSITIVE — AB
Chlamydia: NEGATIVE
Comment: NEGATIVE
Comment: NEGATIVE
Comment: NEGATIVE
Comment: NEGATIVE
Comment: NEGATIVE
Comment: NORMAL
Neisseria Gonorrhea: NEGATIVE
Trichomonas: NEGATIVE

## 2019-06-18 LAB — HIV ANTIBODY (ROUTINE TESTING W REFLEX): HIV Screen 4th Generation wRfx: NONREACTIVE

## 2019-06-18 LAB — RPR: RPR Ser Ql: NONREACTIVE

## 2019-06-21 ENCOUNTER — Other Ambulatory Visit (INDEPENDENT_AMBULATORY_CARE_PROVIDER_SITE_OTHER): Payer: BC Managed Care – PPO | Admitting: Certified Nurse Midwife

## 2019-06-21 DIAGNOSIS — B3731 Acute candidiasis of vulva and vagina: Secondary | ICD-10-CM

## 2019-06-21 DIAGNOSIS — B373 Candidiasis of vulva and vagina: Secondary | ICD-10-CM

## 2019-06-21 MED ORDER — TERCONAZOLE 0.4 % VA CREA
1.0000 | TOPICAL_CREAM | Freq: Every day | VAGINAL | 0 refills | Status: DC
Start: 2019-06-21 — End: 2019-10-24

## 2019-06-21 NOTE — Progress Notes (Signed)
Rx Terazol, see orders.    Diona Fanti, CNM [facility], [facility] 06/21/19 10:35 AM

## 2019-10-21 ENCOUNTER — Telehealth: Payer: Self-pay

## 2019-10-21 NOTE — Telephone Encounter (Signed)
Pt and mom are requesting a referral  to oncology due to left breast lump.   Bx done on lump back in May- neg. She does not care for the surgeon and wants to take it to the next level.   Appt made for 10/22/2019 at 3:45  in case JML prefers to see her before/if referral is placed.   Pt aware she will get a call letting her know if she needs to keep appt or  If JML will refer out.   Pls advise.

## 2019-10-22 ENCOUNTER — Other Ambulatory Visit: Payer: Self-pay

## 2019-10-22 ENCOUNTER — Ambulatory Visit (INDEPENDENT_AMBULATORY_CARE_PROVIDER_SITE_OTHER): Payer: BC Managed Care – PPO | Admitting: Certified Nurse Midwife

## 2019-10-22 ENCOUNTER — Encounter: Payer: Self-pay | Admitting: Certified Nurse Midwife

## 2019-10-22 VITALS — BP 101/70 | HR 87 | Wt 124.9 lb

## 2019-10-22 DIAGNOSIS — L700 Acne vulgaris: Secondary | ICD-10-CM

## 2019-10-22 DIAGNOSIS — Z975 Presence of (intrauterine) contraceptive device: Secondary | ICD-10-CM | POA: Diagnosis not present

## 2019-10-22 DIAGNOSIS — N6321 Unspecified lump in the left breast, upper outer quadrant: Secondary | ICD-10-CM

## 2019-10-22 DIAGNOSIS — N921 Excessive and frequent menstruation with irregular cycle: Secondary | ICD-10-CM

## 2019-10-22 MED ORDER — NORETHIN ACE-ETH ESTRAD-FE 1-20 MG-MCG(24) PO CHEW: 1.0000 | CHEWABLE_TABLET | Freq: Every day | ORAL | 1 refills | Status: DC

## 2019-10-22 MED ORDER — ADAPALENE-BENZOYL PEROXIDE 0.1-2.5 % EX GEL: 1.0000 "application " | Freq: Every day | CUTANEOUS | 0 refills | Status: AC

## 2019-10-22 NOTE — Patient Instructions (Signed)
Ethinyl Estradiol; Norethindrone Acetate; Ferrous fumarate chew tabs (contraception) What is this medicine? ETHINYL ESTRADIOL; NORETHINDRONE ACETATE; FERROUS FUMARATE (ETH in il es tra DYE ole; nor eth IN drone AS e tate; FER [place] FUE ma rate) is an oral contraceptive. The products combine two types of female hormones, an estrogen and a progestin. They are used to prevent ovulation and pregnancy. This medicine may be used for other purposes; ask your health care provider or pharmacist if you have questions. COMMON BRAND NAME(S): Charlotte 24 Fe, Melodetta, Mibelas 24 Fe, Minastrin What should I tell my health care provider before I take this medicine? They need to know if you have any of these conditions:  abnormal vaginal bleeding  blood vessel disease or blood clots  breast, cervical, endometrial, ovarian, liver, or uterine cancer  diabetes  gallbladder disease  heart disease or recent heart attack  high blood pressure  high cholesterol  kidney disease  liver disease  migraine headaches  stroke  systemic lupus erythematosus (SLE)  tobacco smoker  an unusual or allergic reaction to estrogens, progestins, other medicines, foods, dyes, or preservatives  pregnant or trying to get pregnant  breast-feeding How should I use this medicine? Take this medicine by mouth. Take tablet whole or chew it completely before swallowing. If you chew this medicine, drink a full glass of water after. Follow the directions on the prescription label. To reduce nausea, this medicine may be taken with food. Take this medicine at the same time each day and in the order directed on the package. Do not take your medicine more often than directed. A patient package insert for the product will be given with each prescription and refill. Read this sheet carefully each time. The sheet may change frequently. Contact your pediatrician regarding the use of this medicine in children. Special care may be  needed. This medicine has been used in female children who have started having menstrual periods. Overdosage: If you think you have taken too much of this medicine contact a poison control center or emergency room at once. NOTE: This medicine is only for you. Do not share this medicine with others. What if I miss a dose? If you miss a dose, refer to the patient information sheet you received with your medicine for direction. If you miss more than one pill, this medicine may not be as effective and you may need to use another form of birth control. What may interact with this medicine? Do not take this medicine with the following medication:  dasabuvir; ombitasvir; paritaprevir; ritonavir  ombitasvir; paritaprevir; ritonavir This medicine may also interact with the following medications:  acetaminophen  antibiotics or medicines for infections, especially rifampin, rifabutin, rifapentine, and griseofulvin, and possibly penicillins or tetracyclines  aprepitant  ascorbic acid (vitamin C)  atorvastatin  barbiturate medicines, such as phenobarbital  bosentan  carbamazepine  caffeine  clofibrate  cyclosporine  dantrolene  doxercalciferol  felbamate  grapefruit juice  hydrocortisone  medicines for anxiety or sleeping problems, such as diazepam or temazepam  medicines for diabetes, including pioglitazone  mineral oil  modafinil  mycophenolate  nefazodone  oxcarbazepine  phenytoin  prednisolone  ritonavir or other medicines for HIV infection or AIDS  rosuvastatin  selegiline  soy isoflavones supplements  St. John's wort  tamoxifen or raloxifene  theophylline  thyroid hormones  topiramate  warfarin This list may not describe all possible interactions. Give your health care provider a list of all the medicines, herbs, non-prescription drugs, or dietary supplements you use. Also  tell them if you smoke, drink alcohol, or use illegal drugs. Some  items may interact with your medicine. What should I watch for while using this medicine? Visit your doctor or health care professional for regular checks on your progress. You will need a regular breast and pelvic exam and Pap smear while on this medicine. Use an additional method of contraception during the first cycle that you take these tablets. If you have any reason to think you are pregnant, stop taking this medicine right away and contact your doctor or health care professional. If you are taking this medicine for hormone related problems, it may take several cycles of use to see improvement in your condition. Smoking increases the risk of getting a blood clot or having a stroke while you are taking birth control pills, especially if you are more than 20 years old. You are strongly advised not to smoke. This medicine can make your body retain fluid, making your fingers, hands, or ankles swell. Your blood pressure can go up. Contact your doctor or health care professional if you feel you are retaining fluid. This medicine can make you more sensitive to the sun. Keep out of the sun. If you cannot avoid being in the sun, wear protective clothing and use sunscreen. Do not use sun lamps or tanning beds/booths. If you wear contact lenses and notice visual changes, or if the lenses begin to feel uncomfortable, consult your eye care specialist. In some women, tenderness, swelling, or minor bleeding of the gums may occur. Notify your dentist if this happens. Brushing and flossing your teeth regularly may help limit this. See your dentist regularly and inform your dentist of the medicines you are taking. If you are going to have elective surgery, you may need to stop taking this medicine before the surgery. Consult your health care professional for advice. This medicine does not protect you against HIV infection (AIDS) or any other sexually transmitted diseases. What side effects may I notice from  receiving this medicine? Side effects that you should report to your doctor or health care professional as soon as possible:  breast tissue changes or discharge  changes in vaginal bleeding during your period or between your periods  chest pain  coughing up blood  dizziness or fainting spells  headaches or migraines  leg, arm or groin pain  severe or sudden headaches  stomach pain (severe)  sudden shortness of breath  sudden loss of coordination, especially on one side of the body  speech problems  symptoms of vaginal infection like itching, irritation or unusual discharge  tenderness in the upper abdomen  vomiting  weakness or numbness in the arms or legs, especially on one side of the body  yellowing of the eyes or skin Side effects that usually do not require medical attention (report to your doctor or health care professional if they continue or are bothersome):  breakthrough bleeding and spotting that continues beyond the 3 initial cycles of pills  breast tenderness  mood changes, anxiety, depression, frustration, anger, or emotional outbursts  increased sensitivity to sun or ultraviolet light  nausea  skin rash, acne, or brown spots on the skin  weight gain (slight) This list may not describe all possible side effects. Call your doctor for medical advice about side effects. You may report side effects to FDA at 1-800-FDA-1088. Where should I keep my medicine? Keep out of the reach of children. Store at room temperature between 15 and 30 degrees C (59 and 86  degrees F). Throw away any unused medicine after the expiration date. NOTE: This sheet is a summary. It may not cover all possible information. If you have questions about this medicine, talk to your doctor, pharmacist, or health care provider.  2020 Elsevier/Gold Standard (2015-10-09 08:04:01)   Etonogestrel implant What is this medicine? ETONOGESTREL (et oh noe JES trel) is a contraceptive  (birth control) device. It is used to prevent pregnancy. It can be used for up to 3 years. This medicine may be used for other purposes; ask your health care provider or pharmacist if you have questions. COMMON BRAND NAME(S): Implanon, Nexplanon What should I tell my health care provider before I take this medicine? They need to know if you have any of these conditions:  abnormal vaginal bleeding  blood vessel disease or blood clots  breast, cervical, endometrial, ovarian, liver, or uterine cancer  diabetes  gallbladder disease  heart disease or recent heart attack  high blood pressure  high cholesterol or triglycerides  kidney disease  liver disease  migraine headaches  seizures  stroke  tobacco smoker  an unusual or allergic reaction to etonogestrel, anesthetics or antiseptics, other medicines, foods, dyes, or preservatives  pregnant or trying to get pregnant  breast-feeding How should I use this medicine? This device is inserted just under the skin on the inner side of your upper arm by a health care professional. Talk to your pediatrician regarding the use of this medicine in children. Special care may be needed. Overdosage: If you think you have taken too much of this medicine contact a poison control center or emergency room at once. NOTE: This medicine is only for you. Do not share this medicine with others. What if I miss a dose? This does not apply. What may interact with this medicine? Do not take this medicine with any of the following medications:  amprenavir  fosamprenavir This medicine may also interact with the following medications:  acitretin  aprepitant  armodafinil  bexarotene  bosentan  carbamazepine  certain medicines for fungal infections like fluconazole, ketoconazole, itraconazole and voriconazole  certain medicines to treat hepatitis, HIV or  AIDS  cyclosporine  felbamate  griseofulvin  lamotrigine  modafinil  oxcarbazepine  phenobarbital  phenytoin  primidone  rifabutin  rifampin  rifapentine  St. John's wort  topiramate This list may not describe all possible interactions. Give your health care provider a list of all the medicines, herbs, non-prescription drugs, or dietary supplements you use. Also tell them if you smoke, drink alcohol, or use illegal drugs. Some items may interact with your medicine. What should I watch for while using this medicine? This product does not protect you against HIV infection (AIDS) or other sexually transmitted diseases. You should be able to feel the implant by pressing your fingertips over the skin where it was inserted. Contact your doctor if you cannot feel the implant, and use a non-hormonal birth control method (such as condoms) until your doctor confirms that the implant is in place. Contact your doctor if you think that the implant may have broken or become bent while in your arm. You will receive a user card from your health care provider after the implant is inserted. The card is a record of the location of the implant in your upper arm and when it should be removed. Keep this card with your health records. What side effects may I notice from receiving this medicine? Side effects that you should report to your doctor or health care  professional as soon as possible:  allergic reactions like skin rash, itching or hives, swelling of the face, lips, or tongue  breast lumps, breast tissue changes, or discharge  breathing problems  changes in emotions or moods  coughing up blood  if you feel that the implant may have broken or bent while in your arm  high blood pressure  pain, irritation, swelling, or bruising at the insertion site  scar at site of insertion  signs of infection at the insertion site such as fever, and skin redness, pain or discharge  signs and  symptoms of a blood clot such as breathing problems; changes in vision; chest pain; severe, sudden headache; pain, swelling, warmth in the leg; trouble speaking; sudden numbness or weakness of the face, arm or leg  signs and symptoms of liver injury like dark yellow or brown urine; general ill feeling or flu-like symptoms; light-colored stools; loss of appetite; nausea; right upper belly pain; unusually weak or tired; yellowing of the eyes or skin  unusual vaginal bleeding, discharge Side effects that usually do not require medical attention (report to your doctor or health care professional if they continue or are bothersome):  acne  breast pain or tenderness  headache  irregular menstrual bleeding  nausea This list may not describe all possible side effects. Call your doctor for medical advice about side effects. You may report side effects to FDA at 1-800-FDA-1088. Where should I keep my medicine? This drug is given in a hospital or clinic and will not be stored at home. NOTE: This sheet is a summary. It may not cover all possible information. If you have questions about this medicine, talk to your doctor, pharmacist, or health care provider.  2020 Elsevier/Gold Standard (2018-11-10 11:33:04)   Breast Cyst  A breast cyst is a sac in the breast that is filled with fluid. They are usually noncancerous (benign) and are common among women. Breast cysts are most often in the upper, outer portion of the breast. One or more cysts may develop. They form when fluid builds up inside the breast glands. There are several types of breast cysts. Some are too small to feel, but these can be seen with imaging tests such as an X-ray of the breast (mammogram) or ultrasound. Breast cysts do not increase your risk of breast cancer. They usually disappear after you no longer have a menstrual cycle (after menopause), unless you take artificial hormones (are on hormone therapy). What are the causes? This  condition may be caused by:  Blockage of tubes (ducts) in the breast glands, which leads to fluid buildup. Duct blockage may result from: ? Fibrocystic breast changes. This is a common, benign condition that occurs when women go through hormonal changes during the menstrual cycle. This is a common cause of multiple breast cysts. ? Overgrowth of breast tissue or breast glands. ? Scar tissue in the breast from previous surgery.  Changes in certain female hormones (estrogen and progesterone). The exact cause of this condition is not known. What increases the risk? You may be more likely to develop breast cysts if you have not gone through menopause. What are the signs or symptoms? Symptoms of this condition include:  Feeling one or more smooth, round, soft lumps (like grapes) in the breast that are easily movable. The lump or lumps may get bigger and more painful before your menstrual period and get smaller after your menstrual period.  Breast discomfort or pain. How is this diagnosed? This condition may be  diagnosed based on:  A physical exam. A cyst can be felt by your health care provider during this exam.  Imaging tests, such as mammogram or ultrasound. Fluid may be removed from the cyst with a needle (fine-needle aspiration) and tested to make sure the cyst is not cancerous. How is this treated? Treatment may not be needed for this condition. Your health care provider may monitor the cyst to see if it goes away on its own. If the cyst is uncomfortable or gets bigger, or if you do not like how the cyst makes your breast look, you may need treatment. Treatment may include:  Hormone therapy.  Fine-needle aspiration to drain fluid from the cyst. There is a chance of the cyst coming back (recurring) after aspiration.  Surgery to remove the cyst. Follow these instructions at home: Self-exams   Do a breast self-exam every month, or as often as directed. A breast self-exam  involves: ? Comparing your breasts in the mirror. ? Looking for visible changes in your skin or nipples. ? Feeling for lumps or changes.  Having many breast cysts may make it harder to feel for new lumps. Understand how your breasts normally look and feel, and write down any changes in your breasts. Tell your health care provider about any changes. Eating and drinking  Follow instructions from your health care provider about eating and drinking restrictions.  Drink enough fluid to keep your urine pale yellow.  Avoid caffeine.  Cut down on salt (sodium) in what you eat and drink, especially before your menstrual period. Too much sodium can cause fluid buildup, breast swelling, and discomfort. General instructions  See your health care provider regularly. ? Get a yearly physical exam. ? If you are 91-36 years old, get a clinical breast exam every 1-3 years. After the age of 17 years, get this exam every year. ? Get mammograms as often as directed.  Take over-the-counter and prescription medicines only as told by your health care provider.  Wear a supportive bra, especially when exercising.  Keep all follow-up visits as told by your health care provider. This is important. Contact a health care provider if:  You feel, or think you feel, a lump in your breast.  You notice that both breasts look or feel different than usual.  Your breast is still causing pain after your menstrual period is over.  You find new lumps or bumps that were not there before.  You feel lumps in your armpit. Get help right away if:  You have severe pain, tenderness, redness, or warmth in your breast.  You have fluid or blood leaking from your nipple.  Your breast lump becomes hard and painful.  You notice dimpling or wrinkling of the breast or nipple. Summary  A breast cyst is a sac in the breast that is filled with fluid.  Treatment may not be needed for this condition.  If the cyst is  uncomfortable or gets bigger, or if you do not like how the cyst makes your breast look, you may need treatment. This information is not intended to replace advice given to you by your health care provider. Make sure you discuss any questions you have with your health care provider. Document Revised: 06/16/2018 Document Reviewed: 06/16/2018 Elsevier Patient Education  [employer] [place].

## 2019-10-22 NOTE — Progress Notes (Signed)
GYN ENCOUNTER NOTE  Subjective:       Shelley Willis is a 19 y.o. [place] female is here for gynecologic evaluation of the following issues:  1. Left breast lump 2. Breakthrough bleeding with Nexplanon  Patient accompanied by mother. Requests Epiduo refill for patient.   Reports painful left breast lump that has increased in size since last office visit. Previous breast biopsy normal. Does not like surgeon, does not want to return. Desires referral to Oncology for removal of lump.   Notes vaginal bleeding daily since Nexplanon placement which requires patient to wear heavy and overnight pads daily.   Denies difficulty breathing or respiratory distress, chest pain, abdominal pain, excessive vaginal bleeding, dysuria, and leg pain or swelling.    Gynecologic History  No LMP recorded. Patient has had an implant.  Contraception: Nexplanon  Last Pap: N/A  Obstetric History  OB History  Gravida Para Term Preterm AB Living  0 0 0 0 0 0  SAB TAB Ectopic Multiple Live Births  0 0 0 0 0  Obstetric Comments  Menstrual age: 78    Age 1st Pregnancy:N/A    Past Medical History:  Diagnosis Date  . Asthma   . Hemorrhoids     Past Surgical History:  Procedure Laterality Date  . BREAST BIOPSY  06/2019   biopsy    Current Outpatient Medications on File Prior to Visit  Medication Sig Dispense Refill  . etonogestrel (NEXPLANON) 68 MG IMPL implant 1 each by Subdermal route once.    . loratadine (CLARITIN) 10 MG tablet Take 10 mg by mouth daily.    . Acetaminophen-Caff-Pyrilamine (MIDOL MAX ST MENSTRUAL) 500-60-15 MG TABS Take 2 tablets by mouth as needed. (Patient not taking: Reported on 10/22/2019)    . cetirizine (ZYRTEC) 10 MG tablet Take 10 mg by mouth as needed for allergies.    Marland Kitchen terconazole (TERAZOL 7) 0.4 % vaginal cream Place 1 applicator vaginally at bedtime. (Patient not taking: Reported on 10/22/2019) 45 g 0   No current facility-administered medications on file prior to  visit.    Allergies  Allergen Reactions  . Bee Pollen     Social History   Socioeconomic History  . Marital status: Single    Spouse name: Not on file  . Number of children: Not on file  . Years of education: Not on file  . Highest education level: Not on file  Occupational History  . Not on file  Tobacco Use  . Smoking status: Never Smoker  . Smokeless tobacco: Never Used  Vaping Use  . Vaping Use: Former  Substance and Sexual Activity  . Alcohol use: Yes    Comment: occasional   . Drug use: Yes    Types: Marijuana  . Sexual activity: Yes    Birth control/protection: Implant  Other Topics Concern  . Not on file  Social History Narrative  . Not on file   Social Determinants of Health   Financial Resource Strain:   . Difficulty of Paying Living Expenses: Not on file  Food Insecurity:   . Worried About Charity fundraiser in the Last Year: Not on file  . Ran Out of Food in the Last Year: Not on file  Transportation Needs:   . Lack of Transportation (Medical): Not on file  . Lack of Transportation (Non-Medical): Not on file  Physical Activity:   . Days of Exercise per Week: Not on file  . Minutes of Exercise per Session: Not on file  Stress:   .  Feeling of Stress : Not on file  Social Connections:   . Frequency of Communication with Friends and Family: Not on file  . Frequency of Social Gatherings with Friends and Family: Not on file  . Attends Religious Services: Not on file  . Active Member of Clubs or Organizations: Not on file  . Attends Archivist Meetings: Not on file  . Marital Status: Not on file  Intimate Partner Violence:   . Fear of Current or Ex-Partner: Not on file  . Emotionally Abused: Not on file  . Physically Abused: Not on file  . Sexually Abused: Not on file    Family History  Problem Relation Age of Onset  . Fibrocystic breast disease Mother   . Breast cancer Neg Hx   . Ovarian cancer Neg Hx   . Colon cancer Neg Hx      The following portions of the patient's history were reviewed and updated as appropriate: allergies, current medications, past family history, past medical history, past social history, past surgical history and problem list.  Review of Systems  ROS negative except as noted above. Information obtained from patient and mother.  Objective:   BP 101/70   Pulse 87   Wt 124 lb 14.4 oz (56.7 kg)   BMI 18.44 kg/m    CONSTITUTIONAL: Well-developed, well-nourished female in no acute distress.   BREASTS: Symmetric in size  Left: Normal except for breast lump at noon size of bouncy ball, round, mobile and soft, tender to touch  Right: Breast appears normal, no suspicious masses, no skin or nipple changes or axillary nodes.  MUSCULOSKELETAL: Normal range of motion. No tenderness.  No cyanosis, clubbing, or edema.   Assessment:   1. Breakthrough bleeding on Nexplanon   2. Breast lump on left side at 1 o'clock position  - US BREAST LTD UNI LEFT INC AXILLA; Future - Ambulatory referral to General Surgery  3. Acne vulgaris   Plan:   Rx Epiduo and Minastrin, see orders.   Ultrasound ordered, see chart.   Referral to general surgery, see orders.   Reviewed red flag symptoms and when to call.   RTC as previously scheduled or sooner if needed.    Dani Gobble, CNM [facility]   A total of 20 minutes were spent face-to-face with the patient during this encounter and over half of that time dealt with counseling and coordination of care.

## 2019-11-04 ENCOUNTER — Ambulatory Visit
Admission: RE | Admit: 2019-11-04 | Discharge: 2019-11-04 | Disposition: A | Discharge status: Home / Self Care | Payer: BC Managed Care – PPO | Source: Ambulatory Visit | Attending: Certified Nurse Midwife | Admitting: Certified Nurse Midwife

## 2019-11-04 ENCOUNTER — Other Ambulatory Visit: Payer: Self-pay

## 2019-11-04 DIAGNOSIS — N6321 Unspecified lump in the left breast, upper outer quadrant: Secondary | ICD-10-CM

## 2019-11-05 ENCOUNTER — Other Ambulatory Visit: Payer: BC Managed Care – PPO

## 2019-11-18 ENCOUNTER — Other Ambulatory Visit: Payer: Self-pay | Admitting: General Surgery

## 2019-11-18 NOTE — Progress Notes (Signed)
Subjective:     Patient ID: Shelley Willis is a 19 y.o. female.  HPI  The following portions of the patient's history were reviewed and updated as appropriate.  This an established patient is here today for: office visit. Here for evaluation of a left breast lump referred by Shelley Willis CNM.  She states she had a biopsy of the left breast in March showing a fibroadenoma and now wishes to have it removed. She states it has gotten larger.  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  Looking to go to law school.  She is here with her mother, Shelley Willis.  While the family lives in [place], they are looking to have surgery completed locally.    Review of Systems  Constitutional: Negative for chills and fever.  Respiratory: Negative for cough.         Chief Complaint  Patient presents with  . New Patient    left breast lump     BP 114/75   Pulse 78   Temp 37.1 C (98.7 F)   Ht 175.3 cm (5\' 9" )   Wt 57.6 kg (127 lb)   LMP 11/18/2018   BMI 18.75 kg/m       Past Medical History:  Diagnosis Date  . Asthma   . Hemorrhoids           Past Surgical History:  Procedure Laterality Date  . INCISIONAL BIOPSY BREAST Left 05/11/2019   fibroadenoma              OB History    Gravida  0   Para  0   Term  0   Preterm  0   AB  0   Living  0     SAB  0   TAB  0   Ectopic  0   Molar  0   Multiple  0   Live Births  0       Obstetric Comments  Age at first period 60          Social History          Socioeconomic History  . Marital status: Single    Spouse name: Not on file  . Number of children: Not on file  . Years of education: Not on file  . Highest education level: Not on file  Occupational History  . Not on file  Tobacco Use  . Smoking status: Never Smoker  . Smokeless tobacco: Never Used  Substance and Sexual Activity  . Alcohol use: Yes     Comment: occasionally  . Drug use: Never  . Sexual activity: Not on file  Other Topics Concern  . Not on file  Social History Narrative  . Not on file   Social Determinants of Health      Financial Resource Strain:   . Difficulty of Paying Living Expenses:   Food Insecurity:   . Worried About Charity fundraiser in the Last Year:   . Arboriculturist in the Last Year:   Transportation Needs:   . Film/video editor (Medical):   Marland Kitchen Lack of Transportation (Non-Medical):            Allergies  Allergen Reactions  . Bee Pollen Unknown    Current Medications        Current Outpatient Medications  Medication Sig Dispense Refill  . albuterol 90 mcg/actuation inhaler Inhale into the lungs every 6 (  six) hours as needed       . etonorgestrel (NEXPLANON) 68 mg implant Inject subcutaneously    . loratadine (CLARITIN) 10 mg tablet Take by mouth as needed       . KAITLIB FE 0.8mg -24mcg(24) and 75 mg (4) Chew once daily     No current facility-administered medications for this visit.           Family History  Problem Relation Age of Onset  . Fibrocystic breast disease Mother   . Breast cancer Neg Hx   . Ovarian cancer Neg Hx   . Colon cancer Neg Hx         Objective:   Physical Exam Exam conducted with a chaperone present.  Constitutional:      Appearance: Normal appearance.  Cardiovascular:     Rate and Rhythm: Normal rate and regular rhythm.     Pulses: Normal pulses.     Heart sounds: Normal heart sounds.  Pulmonary:     Effort: Pulmonary effort is normal.     Breath sounds: Normal breath sounds.  Chest:     Breasts:        Right: Normal.        Left: Mass (3-4 cm mass near edge of areola at 2 o'clock. ) present.       Comments: Left breast is about 15-20% larger than the right. Musculoskeletal:     Cervical back: Neck supple.  Lymphadenopathy:     Upper Body:     Right upper body: No axillary adenopathy.     Left upper body: No  axillary adenopathy.  Skin:    General: Skin is warm and dry.  Neurological:     Mental Status: She is alert and oriented to person, place, and time.  Psychiatric:        Mood and Affect: Mood normal.        Behavior: Behavior normal.    Labs and Radiology:   Ultrasound-guided core biopsy of the left upper outer quadrant breast mass dated May 11, 2019: DIAGNOSIS:  A. LEFT BREAST, 1:00 2CMFN; ULTRASOUND-GUIDED BIOPSY:  - FIBROADENOMA.  - NEGATIVE FOR ATYPIA AND MALIGNANCY.   October 08, 2018 ultrasound: Targeted ultrasound is performed, showing a mildly hypoechoic oval mass with parallel orientation and increased through transmission measuring 2.8 x 1.5 by 2.8 cm. No axillary adenopathy.  These films were independently reviewed.  April 16, 2019 left breast ultrasound: Targeted ultrasound is performed, showing a well-circumscribed hypoechoic mass in the left breast at 1 o'clock 2 cm from the nipple measuring 3.5 x 1.8 x 3.8 cm.  November 04, 2019 left breast ultrasound: Targeted ultrasound is performed, showing a mass in the left breast at 1 o'clock, 2 cm from the nipple measuring 3.9 by 2.4 by 4.3 cm.     Assessment:     Enlarging left breast fibroadenoma.    Plan:     The patient has had persistent breakthrough bleeding for the past year making use of Nexplanon.  Undergoing evaluation with GYN in this regard.  Enlarging fibroadenoma unlikely to resolve based on size change over the last 13 months.  Indications for elective excision were reviewed.  Risks of surgery including bleeding, infection and seroma formation were discussed.  Procedure is scheduled on: 12-31-19     Entered by Karie Fetch, RN, acting as a scribe for Dr. Hervey Ard, MD.  The documentation recorded by the scribe accurately reflects the service I personally performed and the decisions made by me.  Robert Bellow, MD FACS

## 2019-12-23 ENCOUNTER — Inpatient Hospital Stay
Admission: RE | Admit: 2019-12-23 | Discharge: 2019-12-23 | Disposition: A | Discharge status: Home / Self Care | Payer: BC Managed Care – PPO | Source: Ambulatory Visit

## 2019-12-23 NOTE — Pre-Procedure Instructions (Signed)
Unable to reach patient. Generic VM left at number in chart. Office notified, moved PAT phone call to 12/28/19

## 2019-12-28 ENCOUNTER — Encounter
Admission: RE | Admit: 2019-12-28 | Discharge: 2019-12-28 | Disposition: A | Discharge status: Home / Self Care | Payer: BC Managed Care – PPO | Source: Ambulatory Visit | Attending: General Surgery | Admitting: General Surgery

## 2019-12-28 ENCOUNTER — Other Ambulatory Visit: Payer: Self-pay

## 2019-12-28 DIAGNOSIS — Z01812 Encounter for preprocedural laboratory examination: Secondary | ICD-10-CM | POA: Insufficient documentation

## 2019-12-28 DIAGNOSIS — Z79899 Other long term (current) drug therapy: Secondary | ICD-10-CM | POA: Diagnosis not present

## 2019-12-28 DIAGNOSIS — D242 Benign neoplasm of left breast: Secondary | ICD-10-CM | POA: Diagnosis not present

## 2019-12-28 DIAGNOSIS — Z20822 Contact with and (suspected) exposure to covid-19: Secondary | ICD-10-CM | POA: Diagnosis not present

## 2019-12-28 HISTORY — DX: Headache, unspecified: R51.9

## 2019-12-28 HISTORY — DX: Anxiety disorder, unspecified: F41.9

## 2019-12-28 NOTE — Patient Instructions (Signed)
Your procedure is scheduled on:12-31-19 FRIDAY Report to the Registration Desk on the 1st floor of the [facility]. To find out your arrival time, please call [phone] between 1PM - 3PM on:12-30-19 THURSDAY  REMEMBER: Instructions that are not followed completely may result in serious medical risk, up to and including death; or upon the discretion of your surgeon and anesthesiologist your surgery may need to be rescheduled.  Do not eat food after midnight the night before surgery.  No gum chewing, lozengers or hard candies.  You may however, drink CLEAR liquids up to 2 hours before you are scheduled to arrive for your surgery. Do not drink anything within 2 hours of your scheduled arrival time.  Clear liquids include: - water  - apple juice without pulp - gatorade (not RED, PURPLE, OR BLUE) - black coffee or tea (Do NOT add milk or creamers to the coffee or tea) Do NOT drink anything that is not on this list.  TAKE THESE MEDICATIONS THE MORNING OF SURGERY WITH A SIP OF WATER: -NONE  Use inhalers on the day of surgery and bring to the [facility]  One week prior to surgery: Stop Anti-inflammatories (NSAIDS) such as Advil, Aleve, Ibuprofen, Motrin, Naproxen, Naprosyn and Aspirin based products such as Excedrin, Goodys Powder, BC Powder-OK TO TAKE TYLENOL IF NEEDED  Stop ANY OVER THE COUNTER supplements until after surgery.  No Alcohol for 24 hours before or after surgery.  No Smoking including e-cigarettes for 24 hours prior to surgery.  No chewable tobacco products for at least 6 hours prior to surgery.  No nicotine patches on the day of surgery.  Do not use any "recreational" drugs for at least a week prior to your surgery.  Please be advised that the combination of cocaine and anesthesia may have negative outcomes, up to and including death. If you test positive for cocaine, your surgery will be  cancelled.  On the morning of surgery brush your teeth with toothpaste and water, you may rinse your mouth with mouthwash if you wish. Do not swallow any toothpaste or mouthwash.  Do not wear jewelry, make-up, hairpins, clips or nail polish.  Do not wear lotions, powders, or perfumes.   Do not shave body from the neck down 48 hours prior to surgery just in case you cut yourself which could leave a site for infection.  Also, freshly shaved skin may become irritated if using the CHG soap.  Contact lenses, hearing aids and dentures may not be worn into surgery.  Do not bring valuables to the hospital. [facility] is not responsible for any missing/lost belongings or valuables.   Use CHG Soap as directed on instruction sheet.  Notify your doctor if there is any change in your medical condition (cold, fever, infection).  Wear comfortable clothing (specific to your surgery type) to the hospital.  Plan for stool softeners for home use; pain medications have a tendency to cause constipation. You can also help prevent constipation by eating foods high in fiber such as fruits and vegetables and drinking plenty of fluids as your diet allows.  After surgery, you can help prevent lung complications by doing breathing exercises.  Take deep breaths and cough every 1-2 hours. Your doctor may order a device called an Incentive Spirometer to help you take deep breaths. When coughing or sneezing, hold a pillow firmly against your incision with both hands. This is called "splinting." Doing this helps protect your  incision. It also decreases belly discomfort.  If you are being admitted to the hospital overnight, leave your suitcase in the car. After surgery it may be brought to your room.  If you are being discharged the day of surgery, you will not be allowed to drive home. You will need a responsible adult (18 years or older) to drive you home and stay with you that night.   If you are taking public  transportation, you will need to have a responsible adult (18 years or older) with you. Please confirm with your physician that it is acceptable to use public transportation.   Please call the [facility] at [phone] if you have any questions about these instructions.  Visitation Policy:  Patients undergoing a surgery or procedure may have one family member or support person with them as long as that person is not COVID-19 positive or experiencing its symptoms.  That person may remain in the waiting area during the procedure.  Inpatient Visitation Update:   In an effort to ensure the safety of our team members and our patients, we are implementing a change to our visitation policy:  Effective Monday, Aug. 9, at 7 a.m., inpatients will be allowed one support person.  o The support person may change daily.  o The support person must pass our screening, gel in and out, and wear a mask at all times, including in the patient's room.  o Patients must also wear a mask when staff or their support person are in the room.  o Masking is required regardless of vaccination status.  Systemwide, no visitors 17 or younger.

## 2019-12-29 ENCOUNTER — Other Ambulatory Visit
Admission: RE | Admit: 2019-12-29 | Discharge: 2019-12-29 | Disposition: A | Discharge status: Home / Self Care | Payer: BC Managed Care – PPO | Source: Ambulatory Visit | Attending: General Surgery | Admitting: General Surgery

## 2019-12-29 DIAGNOSIS — Z20822 Contact with and (suspected) exposure to covid-19: Secondary | ICD-10-CM | POA: Insufficient documentation

## 2019-12-29 DIAGNOSIS — D242 Benign neoplasm of left breast: Secondary | ICD-10-CM | POA: Diagnosis not present

## 2019-12-29 DIAGNOSIS — Z01812 Encounter for preprocedural laboratory examination: Secondary | ICD-10-CM | POA: Insufficient documentation

## 2019-12-29 LAB — SARS CORONAVIRUS 2 (TAT 6-24 HRS): SARS Coronavirus 2: NEGATIVE

## 2019-12-30 MED ORDER — ORAL CARE MOUTH RINSE: 15.0000 mL | Freq: Once | OROMUCOSAL | Status: AC

## 2019-12-30 MED ORDER — FAMOTIDINE 20 MG PO TABS: 20.0000 mg | ORAL_TABLET | Freq: Once | ORAL | Status: DC

## 2019-12-30 MED ORDER — LACTATED RINGERS IV SOLN: INTRAVENOUS | Status: DC

## 2019-12-30 MED ORDER — CHLORHEXIDINE GLUCONATE 0.12 % MT SOLN: 15.0000 mL | Freq: Once | OROMUCOSAL | Status: AC

## 2019-12-31 ENCOUNTER — Encounter
Admission: RE | Disposition: A | Discharge status: Home / Self Care | Payer: Self-pay | Source: Home / Self Care | Attending: General Surgery

## 2019-12-31 ENCOUNTER — Ambulatory Visit: Payer: BC Managed Care – PPO | Admitting: Certified Registered Nurse Anesthetist

## 2019-12-31 ENCOUNTER — Encounter: Payer: Self-pay | Admitting: General Surgery

## 2019-12-31 ENCOUNTER — Ambulatory Visit
Admission: RE | Admit: 2019-12-31 | Discharge: 2019-12-31 | Disposition: A | Discharge status: Home / Self Care | Payer: BC Managed Care – PPO | Attending: General Surgery | Admitting: General Surgery

## 2019-12-31 DIAGNOSIS — Z79899 Other long term (current) drug therapy: Secondary | ICD-10-CM | POA: Insufficient documentation

## 2019-12-31 DIAGNOSIS — Z20822 Contact with and (suspected) exposure to covid-19: Secondary | ICD-10-CM | POA: Insufficient documentation

## 2019-12-31 DIAGNOSIS — D242 Benign neoplasm of left breast: Secondary | ICD-10-CM | POA: Diagnosis not present

## 2019-12-31 HISTORY — PX: EXCISION OF BREAST LESION: SHX6676

## 2019-12-31 LAB — URINE DRUG SCREEN, QUALITATIVE (ARMC ONLY)
Amphetamines, Ur Screen: NOT DETECTED
Barbiturates, Ur Screen: NOT DETECTED
Benzodiazepine, Ur Scrn: NOT DETECTED
Cannabinoid 50 Ng, Ur ~~LOC~~: POSITIVE — AB
Cocaine Metabolite,Ur ~~LOC~~: NOT DETECTED
MDMA (Ecstasy)Ur Screen: NOT DETECTED
Methadone Scn, Ur: NOT DETECTED
Opiate, Ur Screen: NOT DETECTED
Phencyclidine (PCP) Ur S: NOT DETECTED
Tricyclic, Ur Screen: NOT DETECTED

## 2019-12-31 LAB — POCT PREGNANCY, URINE: Preg Test, Ur: NEGATIVE

## 2019-12-31 SURGERY — EXCISION OF BREAST LESION
Anesthesia: General | Laterality: Left

## 2019-12-31 MED ORDER — DEXAMETHASONE SODIUM PHOSPHATE 10 MG/ML IJ SOLN
INTRAMUSCULAR | Status: DC | PRN
  Administered 2019-12-31: 6 mg via INTRAVENOUS

## 2019-12-31 MED ORDER — BUPIVACAINE-EPINEPHRINE (PF) 0.5% -1:200000 IJ SOLN
INTRAMUSCULAR | Status: AC
  Filled 2019-12-31: qty 30

## 2019-12-31 MED ORDER — FAMOTIDINE 20 MG PO TABS
ORAL_TABLET | ORAL | Status: AC
  Filled 2019-12-31: qty 1

## 2019-12-31 MED ORDER — CHLORHEXIDINE GLUCONATE 0.12 % MT SOLN
OROMUCOSAL | Status: AC
  Administered 2019-12-31: 15 mL via OROMUCOSAL
  Filled 2019-12-31: qty 15

## 2019-12-31 MED ORDER — LIDOCAINE HCL (CARDIAC) PF 100 MG/5ML IV SOSY
PREFILLED_SYRINGE | INTRAVENOUS | Status: DC | PRN
  Administered 2019-12-31: 60 mg via INTRAVENOUS

## 2019-12-31 MED ORDER — HYDROCODONE-ACETAMINOPHEN 5-325 MG PO TABS: 1.0000 | ORAL_TABLET | ORAL | 0 refills | Status: DC | PRN

## 2019-12-31 MED ORDER — OXYCODONE HCL 5 MG PO TABS: 5.0000 mg | ORAL_TABLET | Freq: Once | ORAL | Status: DC | PRN

## 2019-12-31 MED ORDER — PROPOFOL 10 MG/ML IV BOLUS
INTRAVENOUS | Status: DC | PRN
  Administered 2019-12-31: 150 mg via INTRAVENOUS

## 2019-12-31 MED ORDER — FENTANYL CITRATE (PF) 100 MCG/2ML IJ SOLN
INTRAMUSCULAR | Status: AC
  Filled 2019-12-31: qty 2

## 2019-12-31 MED ORDER — ONDANSETRON HCL 4 MG/2ML IJ SOLN
INTRAMUSCULAR | Status: DC | PRN
  Administered 2019-12-31: 4 mg via INTRAVENOUS

## 2019-12-31 MED ORDER — MIDAZOLAM HCL 2 MG/2ML IJ SOLN
INTRAMUSCULAR | Status: AC
  Filled 2019-12-31: qty 2

## 2019-12-31 MED ORDER — FENTANYL CITRATE (PF) 100 MCG/2ML IJ SOLN: 25.0000 ug | INTRAMUSCULAR | Status: DC | PRN

## 2019-12-31 MED ORDER — ACETAMINOPHEN 10 MG/ML IV SOLN
INTRAVENOUS | Status: AC
  Filled 2019-12-31: qty 100

## 2019-12-31 MED ORDER — MIDAZOLAM HCL 2 MG/2ML IJ SOLN
INTRAMUSCULAR | Status: DC | PRN
  Administered 2019-12-31: 2 mg via INTRAVENOUS

## 2019-12-31 MED ORDER — DEXMEDETOMIDINE (PRECEDEX) IN NS 20 MCG/5ML (4 MCG/ML) IV SYRINGE
PREFILLED_SYRINGE | INTRAVENOUS | Status: DC | PRN
  Administered 2019-12-31: 8 ug via INTRAVENOUS
  Administered 2019-12-31 (×2): 4 ug via INTRAVENOUS

## 2019-12-31 MED ORDER — OXYCODONE HCL 5 MG/5ML PO SOLN: 5.0000 mg | Freq: Once | ORAL | Status: DC | PRN

## 2019-12-31 MED ORDER — ACETAMINOPHEN 10 MG/ML IV SOLN
INTRAVENOUS | Status: DC | PRN
  Administered 2019-12-31: 1000 mg via INTRAVENOUS

## 2019-12-31 MED ORDER — KETOROLAC TROMETHAMINE 30 MG/ML IJ SOLN
INTRAMUSCULAR | Status: DC | PRN
  Administered 2019-12-31: 30 mg via INTRAVENOUS

## 2019-12-31 MED ORDER — BUPIVACAINE-EPINEPHRINE (PF) 0.5% -1:200000 IJ SOLN
INTRAMUSCULAR | Status: DC | PRN
  Administered 2019-12-31: 25 mL

## 2019-12-31 MED ORDER — PROPOFOL 10 MG/ML IV BOLUS
INTRAVENOUS | Status: AC
  Filled 2019-12-31: qty 40

## 2019-12-31 MED ORDER — FENTANYL CITRATE (PF) 100 MCG/2ML IJ SOLN
INTRAMUSCULAR | Status: DC | PRN
  Administered 2019-12-31 (×2): 50 ug via INTRAVENOUS

## 2019-12-31 SURGICAL SUPPLY — 40 items
APL PRP STRL LF DISP 70% ISPRP (MISCELLANEOUS) ×1
BINDER BREAST MEDIUM (GAUZE/BANDAGES/DRESSINGS) ×3 IMPLANT
BLADE SURG 15 STRL SS SAFETY (BLADE) ×6 IMPLANT
CHLORAPREP W/TINT 26 (MISCELLANEOUS) ×3 IMPLANT
CLOSURE WOUND 1/2 X4 (GAUZE/BANDAGES/DRESSINGS) ×1
CNTNR SPEC 2.5X3XGRAD LEK (MISCELLANEOUS)
CONT SPEC 4OZ STER OR WHT (MISCELLANEOUS)
CONT SPEC 4OZ STRL OR WHT (MISCELLANEOUS)
CONTAINER SPEC 2.5X3XGRAD LEK (MISCELLANEOUS) IMPLANT
COVER PROBE FLX POLY STRL (MISCELLANEOUS) ×3 IMPLANT
COVER WAND RF STERILE (DRAPES) ×3 IMPLANT
DRAPE LAPAROTOMY 100X77 ABD (DRAPES) ×3 IMPLANT
DRSG GAUZE FLUFF 36X18 (GAUZE/BANDAGES/DRESSINGS) ×3 IMPLANT
DRSG TELFA 4X3 1S NADH ST (GAUZE/BANDAGES/DRESSINGS) ×3 IMPLANT
ELECT CAUTERY BLADE TIP 2.5 (TIP) ×3
ELECT REM PT RETURN 9FT ADLT (ELECTROSURGICAL) ×3
ELECTRODE CAUTERY BLDE TIP 2.5 (TIP) ×1 IMPLANT
ELECTRODE REM PT RTRN 9FT ADLT (ELECTROSURGICAL) ×1 IMPLANT
GLOVE BIO SURGEON STRL SZ7.5 (GLOVE) ×9 IMPLANT
GLOVE INDICATOR 8.0 STRL GRN (GLOVE) ×9 IMPLANT
GOWN STRL REUS W/ TWL LRG LVL3 (GOWN DISPOSABLE) ×3 IMPLANT
GOWN STRL REUS W/TWL LRG LVL3 (GOWN DISPOSABLE) ×9
KIT TURNOVER KIT A (KITS) ×3 IMPLANT
LABEL OR SOLS (LABEL) ×3 IMPLANT
MANIFOLD NEPTUNE II (INSTRUMENTS) ×3 IMPLANT
MARGIN MAP 10MM (MISCELLANEOUS) ×3 IMPLANT
NEEDLE HYPO 22GX1.5 SAFETY (NEEDLE) ×3 IMPLANT
NEEDLE HYPO 25X1 1.5 SAFETY (NEEDLE) ×3 IMPLANT
PACK BASIN MINOR (MISCELLANEOUS) ×3 IMPLANT
SHEARS HARMONIC 9CM CVD (BLADE) IMPLANT
STRIP CLOSURE SKIN 1/2X4 (GAUZE/BANDAGES/DRESSINGS) ×2 IMPLANT
SUT ETHILON 3-0 FS-10 30 BLK (SUTURE) ×3
SUT VIC AB 2-0 CT1 27 (SUTURE) ×6
SUT VIC AB 2-0 CT1 TAPERPNT 27 (SUTURE) ×2 IMPLANT
SUT VIC AB 4-0 FS2 27 (SUTURE) ×3 IMPLANT
SUTURE EHLN 3-0 FS-10 30 BLK (SUTURE) ×1 IMPLANT
SWABSTK COMLB BENZOIN TINCTURE (MISCELLANEOUS) ×3 IMPLANT
SYR 10ML LL (SYRINGE) ×3 IMPLANT
TAPE TRANSPORE STRL 2 31045 (GAUZE/BANDAGES/DRESSINGS) IMPLANT
WATER STERILE IRR 1000ML POUR (IV SOLUTION) ×3 IMPLANT

## 2019-12-31 NOTE — Transfer of Care (Signed)
Immediate Anesthesia Transfer of Care Note  Patient: Shelley Willis  Procedure(s) Performed: EXCISION OF BREAST LESION (Left )  Patient Location: PACU  Anesthesia Type:General  Level of Consciousness: awake, drowsy and patient cooperative  Airway & Oxygen Therapy: Patient Spontanous Breathing  Post-op Assessment: Report given to RN and Post -op Vital signs reviewed and stable  Post vital signs: Reviewed and stable  Last Vitals:  Vitals Value Taken Time  BP    Temp    Pulse    Resp    SpO2      Last Pain:  Vitals:   12/31/19 1011  TempSrc: Oral  PainSc: 0-No pain     Vitals recorded in vitals section    Complications: No complications documented.

## 2019-12-31 NOTE — H&P (Signed)
Shelley Willis [id number] [birth year]     HPI:  Enlarging fibroadenoma of the left breast. For excision.    Medications Prior to Admission  Medication Sig Dispense Refill Last Dose  . Adapalene-Benzoyl Peroxide 0.1-2.5 % gel Apply 1 application topically at bedtime. 45 g 0 Past Month at Unknown time  . albuterol (VENTOLIN HFA) 108 (90 Base) MCG/ACT inhaler Inhale into the lungs every 6 (six) hours as needed for wheezing or shortness of breath.   Past Month at Unknown time  . amoxicillin (AMOXIL) 250 MG/5ML suspension Take 50 mg by mouth daily. (Patient not taking: Reported on 12/28/2019)     . etonogestrel (NEXPLANON) 68 MG IMPL implant 68 mg by Subdermal route once.    12/31/2019 at Unknown time  . loratadine (CLARITIN) 10 MG tablet Take 10 mg by mouth daily as needed for allergies.    Past Month at Unknown time  . Norethin Ace-Eth Estrad-FE 1-20 MG-MCG(24) CHEW Chew 1 Dose by mouth daily. (Patient not taking: Reported on 12/20/2019) 28 tablet 1 Not Taking at Unknown time   No Known Allergies Past Medical History:  Diagnosis Date  . Anxiety   . Asthma    well controlled  . Headache    migraines x 1  . Hemorrhoids    Past Surgical History:  Procedure Laterality Date  . BREAST BIOPSY  06/2019   biopsy   Social History   Socioeconomic History  . Marital status: Single    Spouse name: Not on file  . Number of children: Not on file  . Years of education: Not on file  . Highest education level: Not on file  Occupational History  . Not on file  Tobacco Use  . Smoking status: Never Smoker  . Smokeless tobacco: Never Used  Vaping Use  . Vaping Use: Every day  . Substances: Nicotine  Substance and Sexual Activity  . Alcohol use: Yes    Comment: occasional   . Drug use: Yes    Types: Marijuana    Comment: occ  . Sexual activity: Yes    Birth control/protection: Implant  Other Topics Concern  . Not on file  Social History Narrative  . Not on file   Social Determinants of  Health   Financial Resource Strain:   . Difficulty of Paying Living Expenses: Not on file  Food Insecurity:   . Worried About Charity fundraiser in the Last Year: Not on file  . Ran Out of Food in the Last Year: Not on file  Transportation Needs:   . Lack of Transportation (Medical): Not on file  . Lack of Transportation (Non-Medical): Not on file  Physical Activity:   . Days of Exercise per Week: Not on file  . Minutes of Exercise per Session: Not on file  Stress:   . Feeling of Stress : Not on file  Social Connections:   . Frequency of Communication with Friends and Family: Not on file  . Frequency of Social Gatherings with Friends and Family: Not on file  . Attends Religious Services: Not on file  . Active Member of Clubs or Organizations: Not on file  . Attends Archivist Meetings: Not on file  . Marital Status: Not on file  Intimate Partner Violence:   . Fear of Current or Ex-Partner: Not on file  . Emotionally Abused: Not on file  . Physically Abused: Not on file  . Sexually Abused: Not on file   Social History   Social History Narrative  .  Not on file     ROS: Negative.     PE: HEENT: Negative. Lungs: Clear. Cardio: RR.   Assessment/Plan:  Proceed with planned left breast fibroadenoma excision.  Forest Gleason [facility] 12/31/2019

## 2019-12-31 NOTE — Anesthesia Postprocedure Evaluation (Signed)
Anesthesia Post Note  Patient: Shelley Willis  Procedure(s) Performed: EXCISION OF BREAST LESION (Left )  Patient location during evaluation: PACU Anesthesia Type: General Level of consciousness: awake and alert Pain management: pain level controlled Vital Signs Assessment: post-procedure vital signs reviewed and stable Respiratory status: spontaneous breathing, nonlabored ventilation, respiratory function stable and patient connected to nasal cannula oxygen Cardiovascular status: blood pressure returned to baseline and stable Postop Assessment: no apparent nausea or vomiting Anesthetic complications: no   No complications documented.   Last Vitals:  Vitals:   12/31/19 1249 12/31/19 1306  BP: 100/62 (!) 107/58  Pulse: 67 63  Resp: 15 16  Temp: 36.8 C 37 C  SpO2: 97% 100%    Last Pain:  Vitals:   12/31/19 1306  TempSrc: Temporal  PainSc: 0-No pain                 Precious Haws Letitia Sabala

## 2019-12-31 NOTE — OR Nursing (Signed)
Per OR room staff, per Dr. Bary Castilla, may discharge pt to home without MD visit to postop.

## 2019-12-31 NOTE — Op Note (Signed)
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  Postoperative diagnosis: Same.  Operative procedure: Excision of left breast juvenile fibroadenoma.  Operating surgeon: Hervey Ard, MD.  Anesthesia: General by LMA, Marcaine 0.5% with 1: 200,000 units of epinephrine, 25 cc.  Estimated blood loss: Less than 5 cc.  Clinical note: This 19 year old woman has had a gradually enlarging mass in the upper outer quadrant of the breast.  Previous core biopsy showed evidence of fibroadenoma.  Due to its increasing size was elected to proceed to excision.  Operative note: The patient had SCD stockings for DVT prevention.  She underwent general anesthesia without difficulty.  The breast was cleansed with ChloraPrep and draped.  A circumareolar incision from the 11-3 o'clock position was made.  The skin was incised sharply and the remaining dissection completed with electrocautery.  The adipose tissue was elevated off the underlying breast parenchyma.  The mass primarily in the 1 o'clock position, 2-3 cm from the nipple was exposed and excised with hemostasis achieved by electrocautery.  The mass was sent in formalin for routine histology.  It was not orientated.  The space previously occupied by the fibroadenoma was closed with interrupted 2-0 Vicryl figure-of-eight sutures.  The subcutaneous fat layer of the periareolar tissue was closed with interrupted 2-0 Vicryl sutures.  The skin was closed with a running 4-0 Vicryl subcuticular suture.  Benzoin, Steri-Strips, Telfa and a fluff gauze dressing with compressive wrap was then applied.  The patient tolerated the procedure well and was taken to recovery room in stable condition.

## 2019-12-31 NOTE — Discharge Instructions (Signed)

## 2019-12-31 NOTE — Anesthesia Procedure Notes (Signed)
Procedure Name: LMA Insertion Date/Time: 12/31/2019 11:27 AM Performed by: Aline Brochure, CRNA Pre-anesthesia Checklist: Patient identified, Patient being monitored, Timeout performed, Emergency Drugs available and Suction available Patient Re-evaluated:Patient Re-evaluated prior to induction Oxygen Delivery Method: Circle system utilized Preoxygenation: Pre-oxygenation with 100% oxygen Induction Type: IV induction Ventilation: Mask ventilation without difficulty LMA: LMA inserted LMA Size: 4.5 Tube type: Oral Number of attempts: 1 Placement Confirmation: positive ETCO2 and breath sounds checked- equal and bilateral Tube secured with: Tape Dental Injury: Teeth and Oropharynx as per pre-operative assessment

## 2019-12-31 NOTE — Anesthesia Preprocedure Evaluation (Addendum)
Anesthesia Evaluation  Patient identified by MRN, date of birth, ID band Patient awake    Reviewed: Allergy & Precautions, H&P , NPO status , Patient's Chart, lab work & pertinent test results  History of Anesthesia Complications Negative for: history of anesthetic complications  Airway Mallampati: II  TM Distance: >3 FB Neck ROM: full    Dental  (+) Chipped Braces:   Pulmonary neg shortness of breath, asthma , Patient abstained from smoking.,    Pulmonary exam normal        Cardiovascular Exercise Tolerance: Good (-) angina(-) Past MI and (-) DOE negative cardio ROS Normal cardiovascular exam     Neuro/Psych  Headaches, negative psych ROS   GI/Hepatic negative GI ROS, Neg liver ROS,   Endo/Other  negative endocrine ROS  Renal/GU      Musculoskeletal   Abdominal   Peds  Hematology negative hematology ROS (+)   Anesthesia Other Findings Past Medical History: No date: Anxiety No date: Asthma     Comment:  well controlled No date: Headache     Comment:  migraines x 1 No date: Hemorrhoids  Past Surgical History: 06/2019: BREAST BIOPSY     Comment:  biopsy  BMI    Body Mass Index: 18.90 kg/m      Reproductive/Obstetrics negative OB ROS                             Anesthesia Physical Anesthesia Plan  ASA: III  Anesthesia Plan: General LMA   Post-op Pain Management:    Induction: Intravenous  PONV Risk Score and Plan: Dexamethasone, Ondansetron, Midazolam and Treatment may vary due to age or medical condition  Airway Management Planned: LMA  Additional Equipment:   Intra-op Plan:   Post-operative Plan: Extubation in OR  Informed Consent: I have reviewed the patients History and Physical, chart, labs and discussed the procedure including the risks, benefits and alternatives for the proposed anesthesia with the patient or authorized representative who has indicated  his/her understanding and acceptance.     Dental Advisory Given  Plan Discussed with: Anesthesiologist, CRNA and Surgeon  Anesthesia Plan Comments: (Patient consented for risks of anesthesia including but not limited to:  - adverse reactions to medications - damage to eyes, teeth, lips, other oral mucosa or braces - nerve damage due to positioning  - sore throat or hoarseness - Damage to heart, brain, nerves, lungs, other parts of body or loss of life  Patient voiced understanding.)       Anesthesia Quick Evaluation

## 2020-01-03 ENCOUNTER — Other Ambulatory Visit: Payer: Self-pay | Admitting: General Surgery

## 2020-01-03 LAB — SURGICAL PATHOLOGY

## 2020-04-24 ENCOUNTER — Other Ambulatory Visit: Payer: Self-pay | Admitting: Certified Nurse Midwife

## 2020-04-24 MED ORDER — NORETHIN ACE-ETH ESTRAD-FE 1-20 MG-MCG(24) PO CHEW: 1.0000 | CHEWABLE_TABLET | Freq: Every day | ORAL | 1 refills | Status: DC

## 2020-04-24 NOTE — Progress Notes (Signed)
Rx Minastrin chewable, see orders.    Dani Gobble, CNM [facility] 04/24/20 3:52 PM

## 2020-05-05 ENCOUNTER — Encounter: Payer: Self-pay | Admitting: Certified Nurse Midwife

## 2020-06-19 ENCOUNTER — Encounter: Payer: BC Managed Care – PPO | Admitting: Certified Nurse Midwife

## 2020-06-24 ENCOUNTER — Other Ambulatory Visit: Payer: Self-pay | Admitting: Certified Nurse Midwife

## 2020-06-26 ENCOUNTER — Encounter: Payer: Self-pay | Admitting: Certified Nurse Midwife

## 2020-06-26 ENCOUNTER — Encounter: Payer: BC Managed Care – PPO | Admitting: Certified Nurse Midwife

## 2020-07-06 ENCOUNTER — Encounter: Payer: BC Managed Care – PPO | Admitting: Certified Nurse Midwife

## 2020-07-06 DIAGNOSIS — Z1159 Encounter for screening for other viral diseases: Secondary | ICD-10-CM

## 2020-07-06 DIAGNOSIS — Z124 Encounter for screening for malignant neoplasm of cervix: Secondary | ICD-10-CM

## 2020-07-06 DIAGNOSIS — Z23 Encounter for immunization: Secondary | ICD-10-CM

## 2020-07-20 ENCOUNTER — Encounter: Payer: Self-pay | Admitting: Certified Nurse Midwife

## 2020-07-20 ENCOUNTER — Other Ambulatory Visit: Payer: Self-pay

## 2020-07-20 ENCOUNTER — Ambulatory Visit (INDEPENDENT_AMBULATORY_CARE_PROVIDER_SITE_OTHER): Payer: BC Managed Care – PPO | Admitting: Certified Nurse Midwife

## 2020-07-20 ENCOUNTER — Other Ambulatory Visit (HOSPITAL_COMMUNITY)
Admission: RE | Admit: 2020-07-20 | Discharge: 2020-07-20 | Disposition: A | Discharge status: Home / Self Care | Payer: BC Managed Care – PPO | Source: Ambulatory Visit | Attending: Certified Nurse Midwife | Admitting: Certified Nurse Midwife

## 2020-07-20 VITALS — BP 104/67 | HR 91 | Resp 16 | Ht 69.0 in | Wt 126.0 lb

## 2020-07-20 DIAGNOSIS — Z975 Presence of (intrauterine) contraceptive device: Secondary | ICD-10-CM | POA: Diagnosis not present

## 2020-07-20 DIAGNOSIS — Z3041 Encounter for surveillance of contraceptive pills: Secondary | ICD-10-CM

## 2020-07-20 DIAGNOSIS — Z01419 Encounter for gynecological examination (general) (routine) without abnormal findings: Secondary | ICD-10-CM | POA: Insufficient documentation

## 2020-07-20 DIAGNOSIS — Z124 Encounter for screening for malignant neoplasm of cervix: Secondary | ICD-10-CM

## 2020-07-20 DIAGNOSIS — Z113 Encounter for screening for infections with a predominantly sexual mode of transmission: Secondary | ICD-10-CM | POA: Diagnosis not present

## 2020-07-20 MED ORDER — NORETHIN ACE-ETH ESTRAD-FE 1-20 MG-MCG(24) PO CHEW: 1.0000 | CHEWABLE_TABLET | Freq: Every day | ORAL | 12 refills | Status: AC

## 2020-07-20 NOTE — Patient Instructions (Signed)
Pap Test Why am I having this test? A Pap test, also called a Pap smear, is a screening test to check for signs of: Cancer of the vagina, cervix, and uterus. The cervix is the lower part of the uterus that opens into the vagina. Infection. Changes that may be a sign that cancer is developing (precancerous changes). Women need this test on a regular basis. In general, you should have a Pap test every 3 years until you reach menopause or age 21. Women aged 30-60 may choose to have their Pap test done at the same time as an HPV (human papillomavirus) test every 5 years (instead of every 3 years). Your health care provider may recommend having Pap tests more or less often depending on your medical conditions and past Pap test results. What kind of sample is taken? Your health care provider will collect a sample of cells from the surface of your cervix. This will be done using a small cotton swab, plastic spatula, or brush. This sample is often collected during a pelvic exam, when you are lying on your back on an exam table with feet in footrests (stirrups). In some cases, fluids (secretions) from the cervix or vagina may also be collected.   How do I prepare for this test? Be aware of where you are in your menstrual cycle. If you are menstruating on the day of the test, you may be asked to reschedule. You may need to reschedule if you have a known vaginal infection on the day of the test. Follow instructions from your health care provider about: Changing or stopping your regular medicines. Some medicines can cause abnormal test results, such as digitalis and tetracycline. Avoiding douching or taking a bath the day before or the day of the test. Tell a health care provider about: Any allergies you have. All medicines you are taking, including vitamins, herbs, eye drops, creams, and over-the-counter medicines. Any blood disorders you have. Any surgeries you have had. Any medical conditions you  have. Whether you are pregnant or may be pregnant. How are the results reported? Your test results will be reported as either abnormal or normal. A false-positive result can occur. A false positive is incorrect because it means that a condition is present when it is not. A false-negative result can occur. A false negative is incorrect because it means that a condition is not present when it is. What do the results mean? A normal test result means that you do not have signs of cancer of the vagina, cervix, or uterus. An abnormal result may mean that you have: Cancer. A Pap test by itself is not enough to diagnose cancer. You will have more tests done in this case. Precancerous changes in your vagina, cervix, or uterus. Inflammation of the cervix. An STD (sexually transmitted disease). A fungal infection. A parasite infection. Talk with your health care provider about what your results mean. Questions to ask your health care provider Ask your health care provider, or the department that is doing the test: When will my results be ready? How will I get my results? What are my treatment options? What other tests do I need? What are my next steps? Summary In general, women should have a Pap test every 3 years until they reach menopause or age 88. Your health care provider will collect a sample of cells from the surface of your cervix. This will be done using a small cotton swab, plastic spatula, or brush. In some cases, fluids (  secretions) from the cervix or vagina may also be collected. This information is not intended to replace advice given to you by your health care provider. Make sure you discuss any questions you have with your health care provider. Document Revised: 10/06/2019 Document Reviewed: 10/01/2019 Elsevier Patient Education  [place] 61-81 Years Old, Female Preventive care refers to lifestyle choices and visits with your health care provider  that can promote health and wellness. This includes: A yearly physical exam. This is also called an annual wellness visit. Regular dental and eye exams. Immunizations. Screening for certain conditions. Healthy lifestyle choices, such as: Eating a healthy diet. Getting regular exercise. Not using drugs or products that contain nicotine and tobacco. Limiting alcohol use. What can I expect for my preventive care visit? Physical exam Your health care provider may check your: Height and weight. These may be used to calculate your BMI (body mass index). BMI is a measurement that tells if you are at a healthy weight. Heart rate and blood pressure. Body temperature. Skin for abnormal spots. Counseling Your health care provider may ask you questions about your: Past medical problems. Family's medical history. Alcohol, tobacco, and drug use. Emotional well-being. Home life and relationship well-being. Sexual activity. Diet, exercise, and sleep habits. Work and work Statistician. Access to firearms. Method of birth control. Menstrual cycle. Pregnancy history. What immunizations do I need? Vaccines are usually given at various ages, according to a schedule. Your health care provider will recommend vaccines for you based on your age, medical history, and lifestyle or other factors, such as travel or where you work.   What tests do I need? Blood tests Lipid and cholesterol levels. These may be checked every 5 years starting at age 43. Hepatitis C test. Hepatitis B test. Screening Diabetes screening. This is done by checking your blood sugar (glucose) after you have not eaten for a while (fasting). STD (sexually transmitted disease) testing, if you are at risk. BRCA-related cancer screening. This may be done if you have a family history of breast, ovarian, tubal, or peritoneal cancers. Pelvic exam and Pap test. This may be done every 3 years starting at age 68. Starting at age 25, this may  be done every 5 years if you have a Pap test in combination with an HPV test. Talk with your health care provider about your test results, treatment options, and if necessary, the need for more tests.   Follow these instructions at home: Eating and drinking Eat a healthy diet that includes fresh fruits and vegetables, whole grains, lean protein, and low-fat dairy products. Take vitamin and mineral supplements as recommended by your health care provider. Do not drink alcohol if: Your health care provider tells you not to drink. You are pregnant, may be pregnant, or are planning to become pregnant. If you drink alcohol: Limit how much you have to 0-1 drink a day. Be aware of how much alcohol is in your drink. In the [place] one drink equals one 12 oz bottle of beer (355 mL), one 5 oz glass of wine (148 mL), or one 1 oz glass of hard liquor (44 mL).   Lifestyle Take daily care of your teeth and gums. Brush your teeth every morning and night with fluoride toothpaste. Floss one time each day. Stay active. Exercise for at least 30 minutes 5 or more days each week. Do not use any products that contain nicotine or tobacco, such as cigarettes, e-cigarettes, and chewing tobacco. If  you need help quitting, ask your health care provider. Do not use drugs. If you are sexually active, practice safe sex. Use a condom or other form of protection to prevent STIs (sexually transmitted infections). If you do not wish to become pregnant, use a form of birth control. If you plan to become pregnant, see your health care provider for a prepregnancy visit. Find healthy ways to cope with stress, such as: Meditation, yoga, or listening to music. Journaling. Talking to a trusted person. Spending time with friends and family. Safety Always wear your seat belt while driving or riding in a vehicle. Do not drive: If you have been drinking alcohol. Do not ride with someone who has been drinking. When you are tired or  distracted. While texting. Wear a helmet and other protective equipment during sports activities. If you have firearms in your house, make sure you follow all gun safety procedures. Seek help if you have been physically or sexually abused. What's next? Go to your health care provider once a year for an annual wellness visit. Ask your health care provider how often you should have your eyes and teeth checked. Stay up to date on all vaccines. This information is not intended to replace advice given to you by your health care provider. Make sure you discuss any questions you have with your health care provider. Document Revised: 09/26/2019 Document Reviewed: 10/09/2017 Elsevier Patient Education  2021 [employer].

## 2020-07-20 NOTE — Progress Notes (Signed)
ANNUAL PREVENTATIVE CARE GYN  ENCOUNTER NOTE  Subjective:       Shelley Willis is a 20 y.o. G0P0000 female here for a routine annual gynecologic exam.  Current complaints: 1.  STD screening and pap 2. Desires refill on birth control for breakthrough bleeding if needed; and adapalene for skin  Denies difficulty breathing or respiratory distress, chest pain, abdominal pain or discomfort, breakthrough vaginal bleeding, and/or leg pain or swelling.  Gynecologic History  No LMP recorded. Patient has had an implant.  Contraception: Nexplanon inserted 09/29/2018  Last Pap: today.   Obstetric History  OB History  Gravida Para Term Preterm AB Living  0 0 0 0 0 0  SAB IAB Ectopic Multiple Live Births  0 0 0 0 0  Obstetric Comments  Menstrual age: 20    Age 1st Pregnancy:N/A    Past Medical History:  Diagnosis Date   Anxiety    Asthma    well controlled   Headache    migraines x 1   Hemorrhoids     Past Surgical History:  Procedure Laterality Date   BREAST BIOPSY  06/2019   biopsy   EXCISION OF BREAST LESION Left 12/31/2019   Procedure: EXCISION OF BREAST LESION;  Surgeon: Robert Bellow, MD;  Location: [facility] ORS;  Service: General;  Laterality: Left;    Current Outpatient Medications on File Prior to Visit  Medication Sig Dispense Refill   Adapalene-Benzoyl Peroxide 0.1-2.5 % gel Apply 1 application topically at bedtime. 45 g 0   albuterol (VENTOLIN HFA) 108 (90 Base) MCG/ACT inhaler Inhale into the lungs every 6 (six) hours as needed for wheezing or shortness of breath.     etonogestrel (NEXPLANON) 68 MG IMPL implant 68 mg by Subdermal route once.      loratadine (CLARITIN) 10 MG tablet Take 10 mg by mouth daily as needed for allergies.      No current facility-administered medications on file prior to visit.    No Known Allergies  Social History   Socioeconomic History   Marital status: Single    Spouse name: Not on file   Number of children: Not on file    Years of education: Not on file   Highest education level: Not on file  Occupational History   Not on file  Tobacco Use   Smoking status: Never   Smokeless tobacco: Never  Vaping Use   Vaping Use: Every day   Substances: Nicotine  Substance and Sexual Activity   Alcohol use: Yes    Comment: occasional    Drug use: Yes    Types: Marijuana    Comment: occ   Sexual activity: Yes    Birth control/protection: Implant  Other Topics Concern   Not on file  Social History Narrative   Not on file   Social Determinants of Health   Financial Resource Strain: Not on file  Food Insecurity: Not on file  Transportation Needs: Not on file  Physical Activity: Not on file  Stress: Not on file  Social Connections: Not on file  Intimate Partner Violence: Not on file    Family History  Problem Relation Age of Onset   Fibrocystic breast disease Mother    Breast cancer Neg Hx    Ovarian cancer Neg Hx    Colon cancer Neg Hx     The following portions of the patient's history were reviewed and updated as appropriate: allergies, current medications, past family history, past medical history, past social history, past surgical history  and problem list.  Review of Systems  ROS- Negative other than what was reported above. Information obtained verbally from patient.  Objective:   BP 104/67   Pulse 91   Resp 16   Ht 5\' 9"  (1.753 m)   Wt 57.2 kg   BMI 18.61 kg/m   CONSTITUTIONAL: Well-developed, well-nourished female in no acute distress.   PSYCHIATRIC: Normal mood and affect. Normal behavior. Normal judgment and thought content.  [place]: Alert and oriented to person, place, and time. Normal muscle tone coordination. No cranial nerve deficit noted.  EYES: Conjunctivae and EOM are normal.   NECK: Normal range of motion, supple, no masses.  Normal thyroid.   SKIN: Skin is warm and dry. No rash noted. Not diaphoretic. No erythema. No pallor. Professional tattoos  noted.  CARDIOVASCULAR: Normal heart rate noted, regular rhythm, no murmur.  RESPIRATORY: Clear to auscultation bilaterally. Effort and breath sounds normal, no problems with respiration noted.  BREASTS: Symmetric in size. No masses, skin changes, nipple drainage, or lymphadenopathy. Left: Scar tissue noted at the 1 o'clock position.  ABDOMEN: Soft, normal bowel sounds, no distention noted.  No tenderness, rebound or guarding.   PELVIC:  External Genitalia: Normal  BUS: Normal  Vagina: Normal  Cervix: Normal, pap collected  Uterus: Normal  Adnexa: Normal   MUSCULOSKELETAL: Normal range of motion. No tenderness.   Assessment:   Annual gynecologic examination 20 y.o.  Contraception: Nexplanon  Normal BMI  Problem List Items Addressed This Visit   None Visit Diagnoses     Well woman exam    -  Primary   Encounter for surveillance of contraceptive pills       Nexplanon in place       Screen for STD (sexually transmitted disease)           Plan:   Pap: Pap, Reflex if ASCUS and GC/CT NAAT, trich and herpes  Mammogram: Not Indicated  Labs:  Declined  Rx: Refills ordered, see chart  Routine preventative health maintenance measures emphasized: Exercise/Diet/Weight control, Tobacco Warnings, Alcohol/Substance use risks, Stress Management, and Peer Pressure Issues  Reviewed red flag symptoms and when to call  RTC x 1 year for ANNUAL Well WOMAN EXAM or sooner if needed  Daneil Dan, [employer] 07/20/20 4:10 PM

## 2020-07-21 NOTE — Progress Notes (Signed)
I have seen, interviewed, and examined the patient in conjunction with the [facility] and affirm the diagnosis and management plan.   Diona Fanti, CNM [facility], [facility] 07/21/20 12:55 PM

## 2020-07-28 LAB — CYTOLOGY - PAP
Chlamydia: NEGATIVE
Comment: NEGATIVE
Comment: NEGATIVE
Comment: NEGATIVE
Comment: NORMAL
Diagnosis: NEGATIVE
HSV1: NEGATIVE
HSV2: NEGATIVE
Neisseria Gonorrhea: NEGATIVE
Trichomonas: NEGATIVE

## 2022-02-26 IMAGING — US US BREAST*L* LIMITED INC AXILLA
1 series · 7 of 7 positions shown · non-contrast
Comparison: Previous exam(s).

CLINICAL DATA: Short-term interval follow-up of a left breast mass.
The patient states that the left breast mass is clinically
enlarging.

EXAM:
ULTRASOUND OF THE LEFT BREAST

[Series 1: us breast*left* limited inc axilla · 0.07mm/px · 7 of 7 slices shown]
[im 1/7]
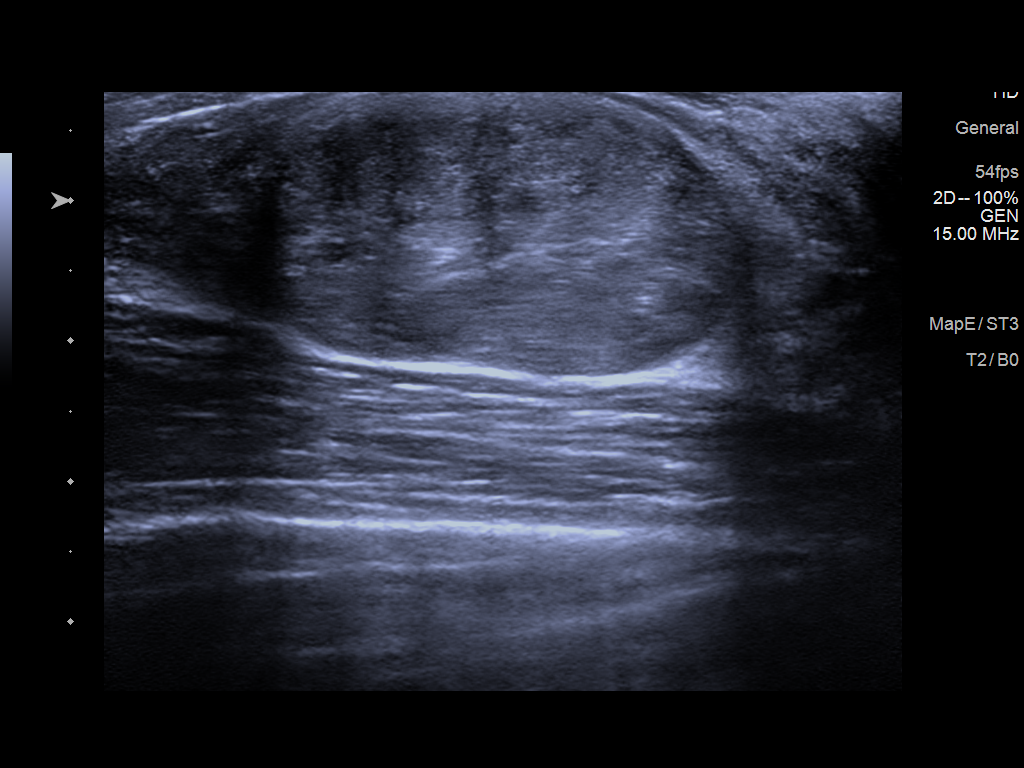
[im 2/7]
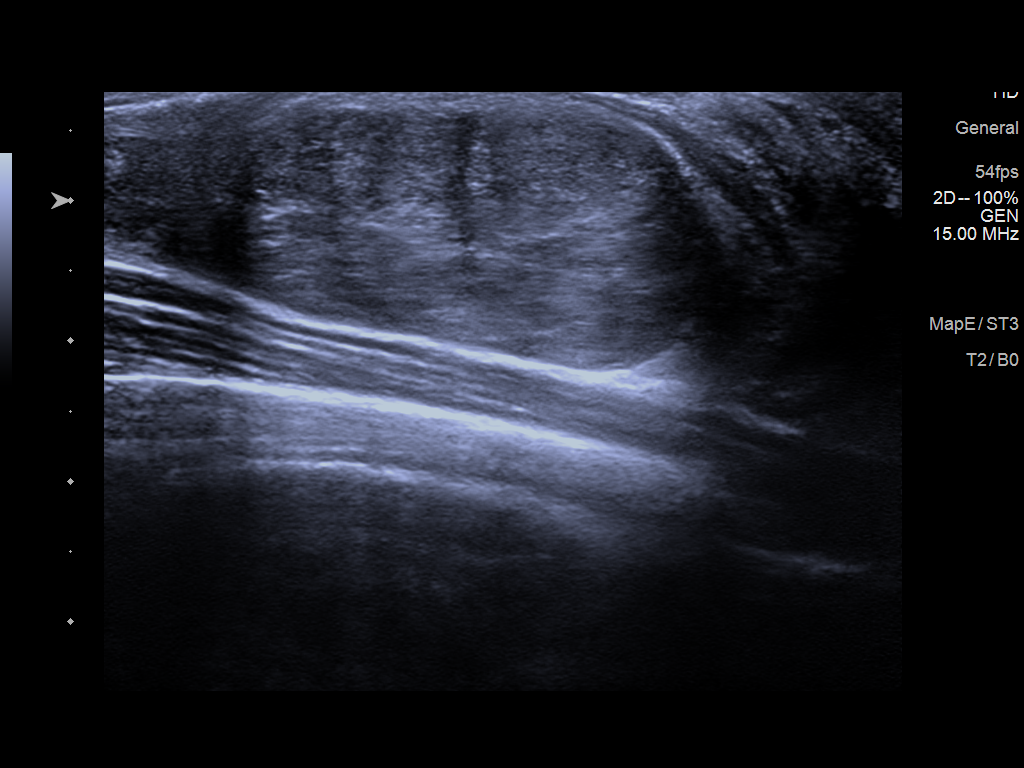
[im 3/7]
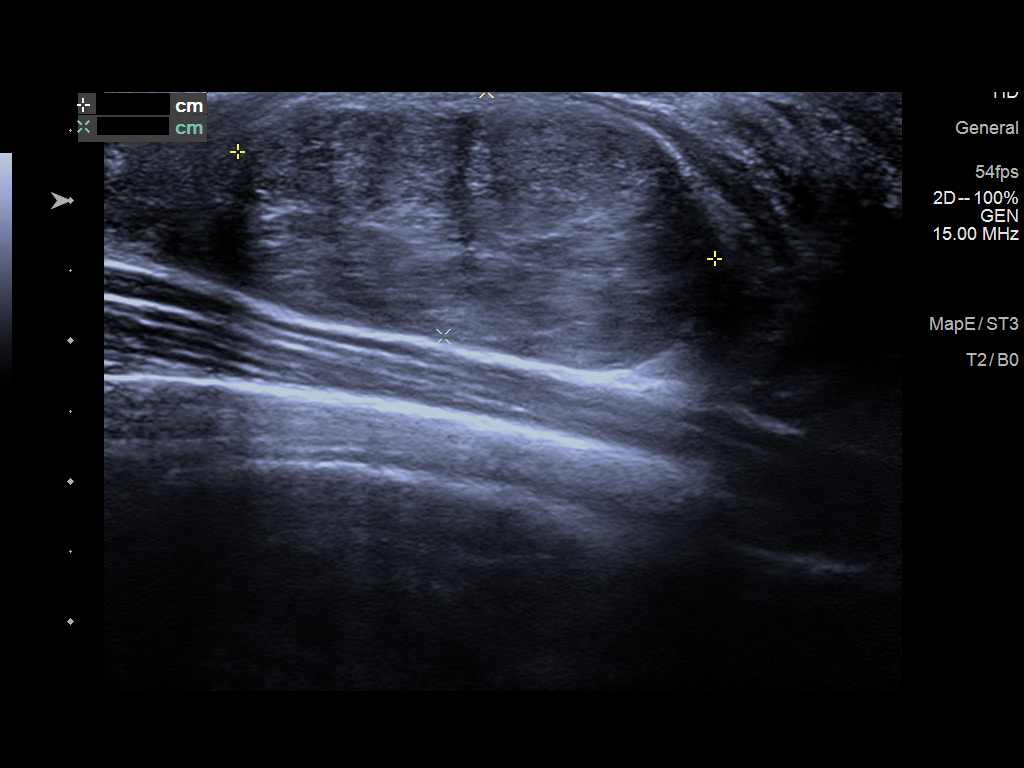
[im 4/7]
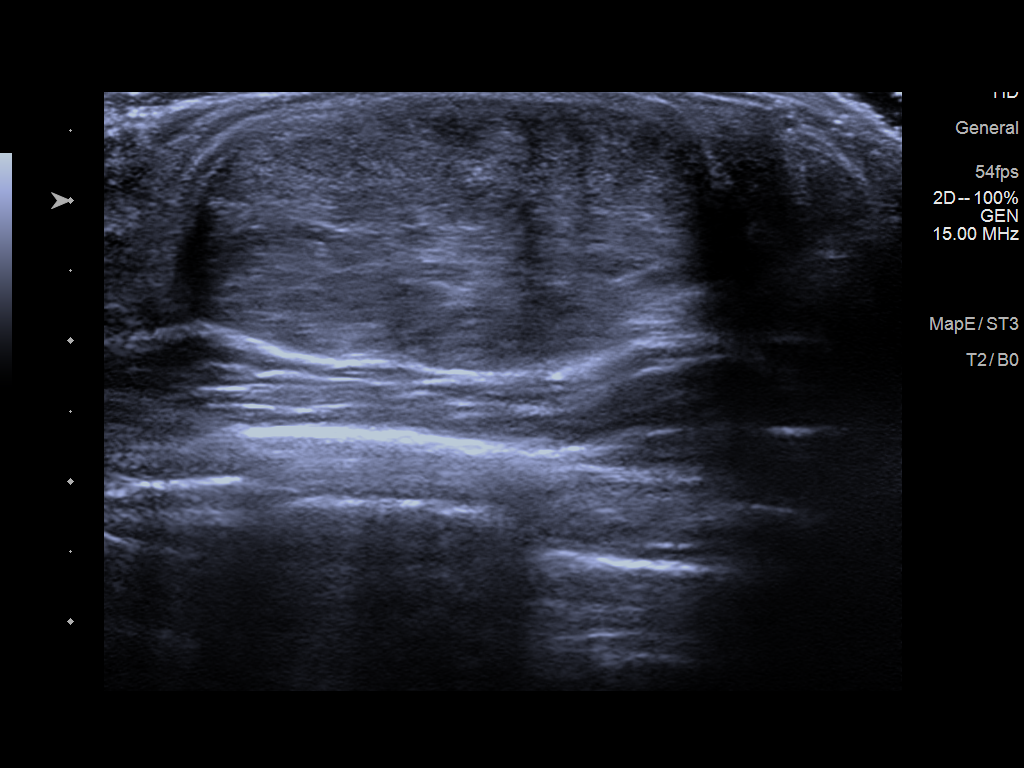
[im 5/7]
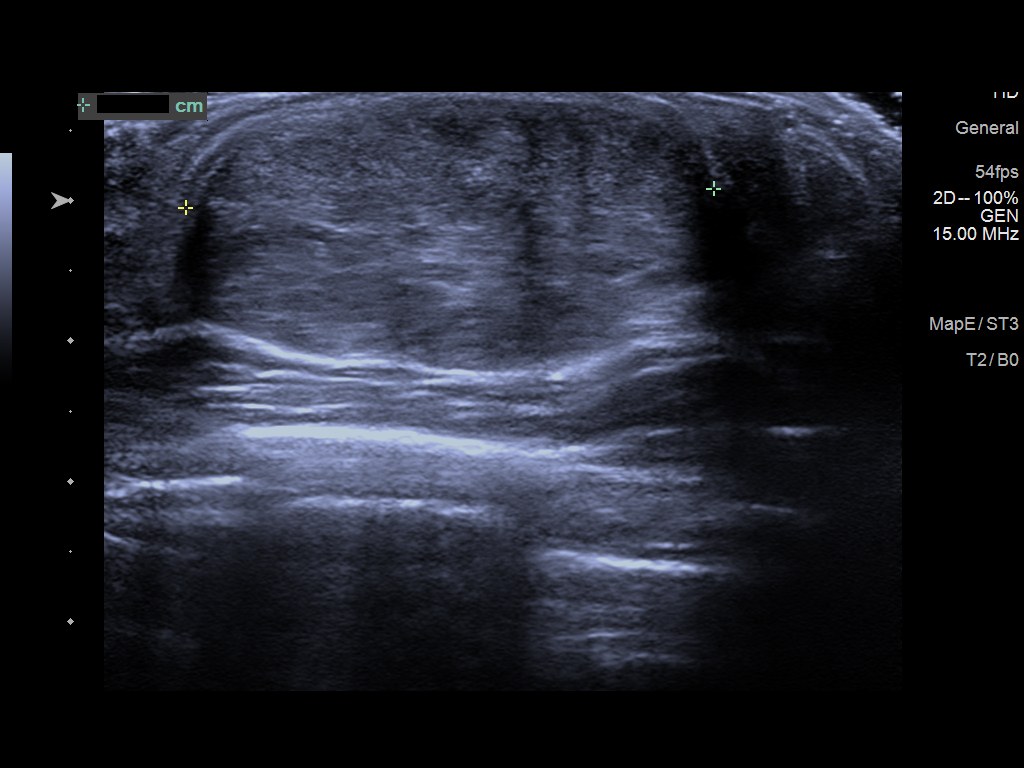
[im 6/7]
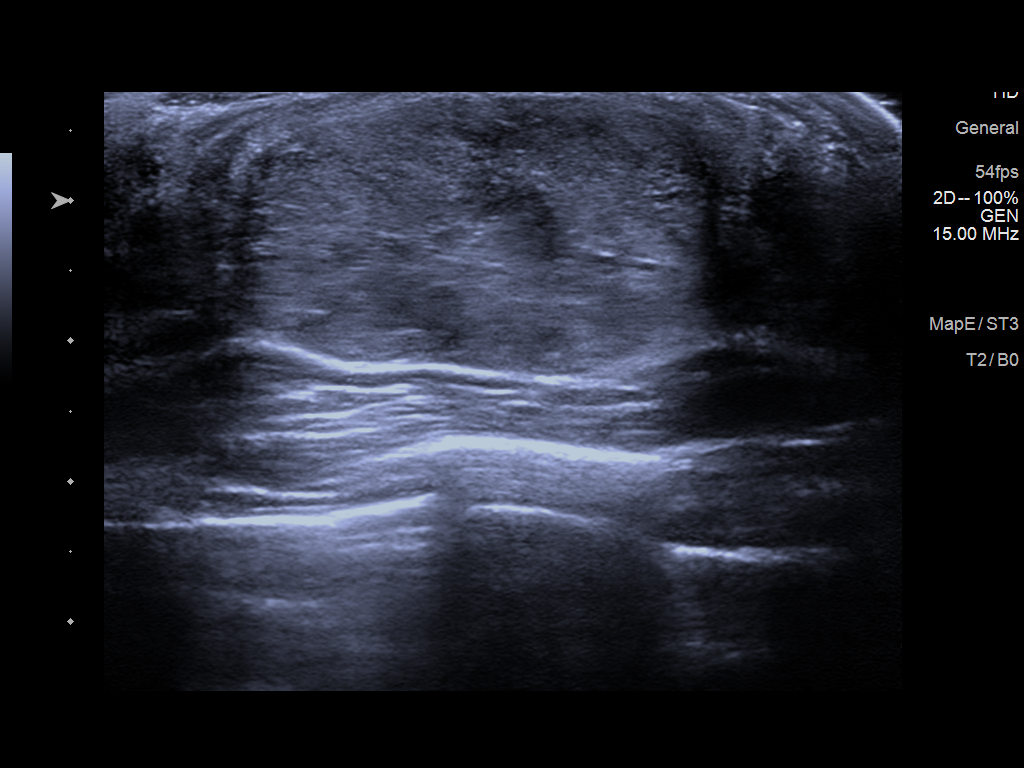
[im 7/7]
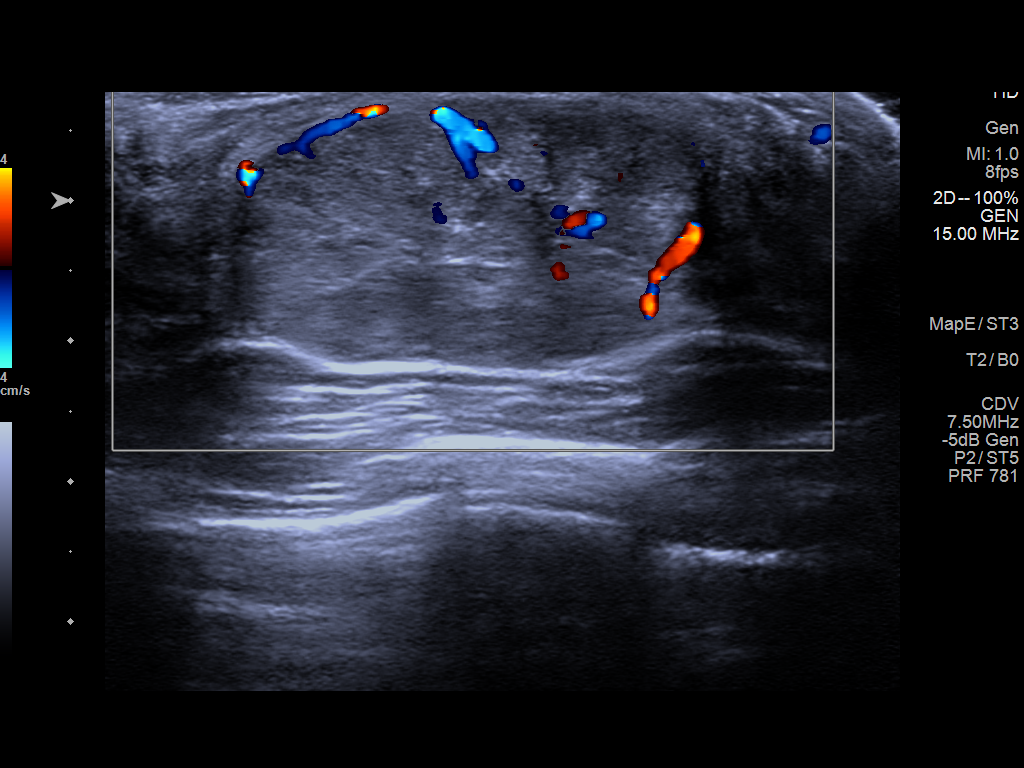

[7 of 7 positions shown; findings below may reference images not displayed]

FINDINGS: On physical exam, I palpate a discrete mass in left breast at 1
o'clock 2 cm from the nipple.

Targeted ultrasound is performed, showing a well-circumscribed
hypoechoic mass in the left breast at 1 o'clock 2 cm from the nipple
measuring 3.5 x 1.8 x 3.8 cm. On the prior ultrasound dated
10/08/2018 it measured 2.8 x 1.5 x 2.8 cm. Sonographic evaluation
the left axilla does not show any enlarged adenopathy.
IMPRESSION: Indeterminate left breast mass.

RECOMMENDATION:
Ultrasound-guided core biopsy of the mass in the 1 o'clock region of
the left breast is recommended.

I have discussed the findings and recommendations with the patient.
If applicable, a reminder letter will be sent to the patient
regarding the next appointment.

BI-RADS CATEGORY  4: Suspicious.

## 2022-03-23 IMAGING — MG US BREAST BX W LOC DEV 1ST LESION IMG BX SPEC US GUIDE*L*
1 series · 8 of 8 positions shown · non-contrast
Comparison: Previous exam(s).
COMPARISON: Previous exam(s).

Addendum:
CLINICAL DATA: 19-year-old female presenting for ultrasound-guided
biopsy of a left breast mass.

EXAM:
ULTRASOUND GUIDED LEFT BREAST CORE NEEDLE BIOPSY

[Series 1: MG view · 0.06mm/px · 8 of 10 slices shown]
[im 1/10]
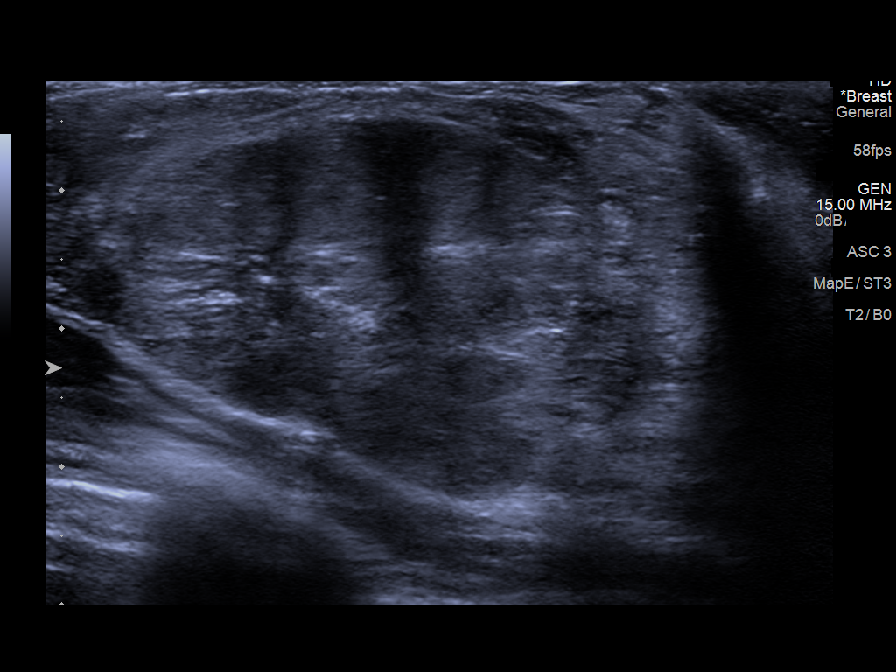
[im 2/10]
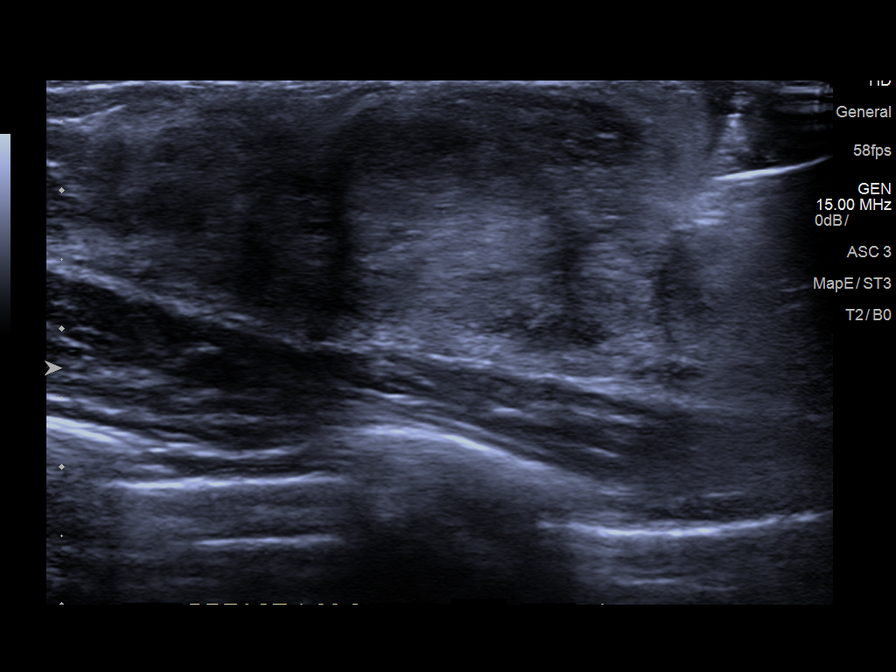
[im 3/10]
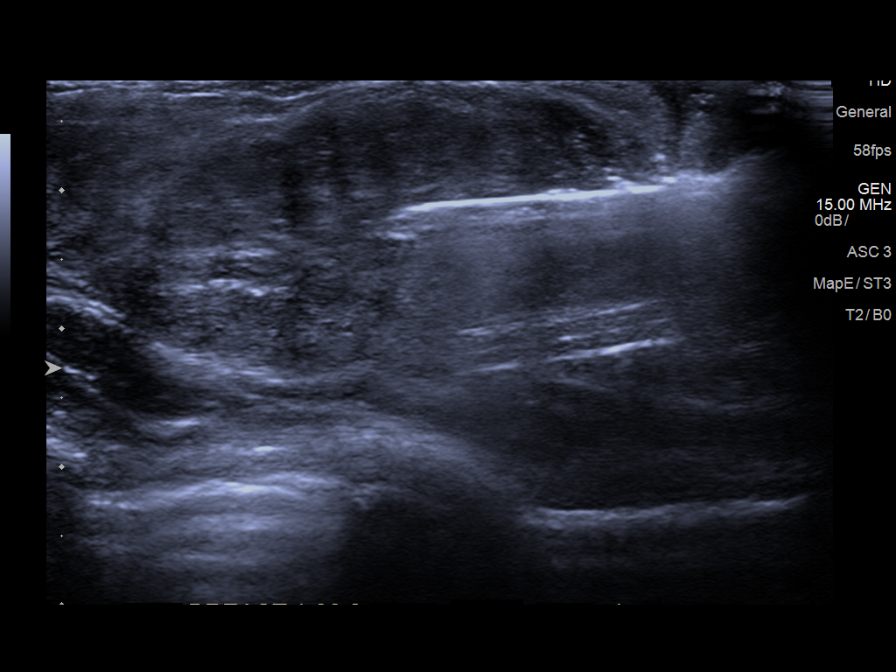
[im 4/10]
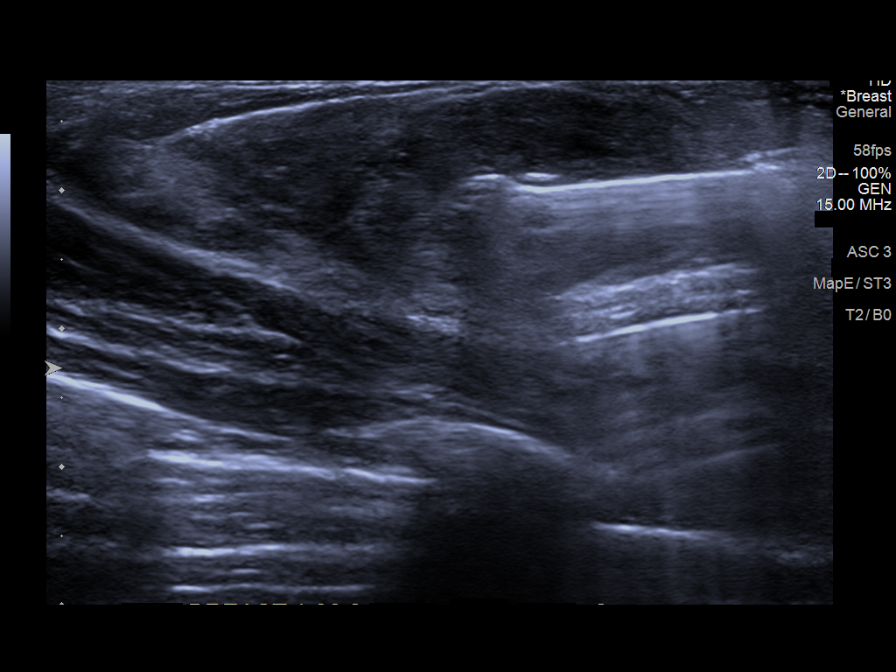
[im 6/10]
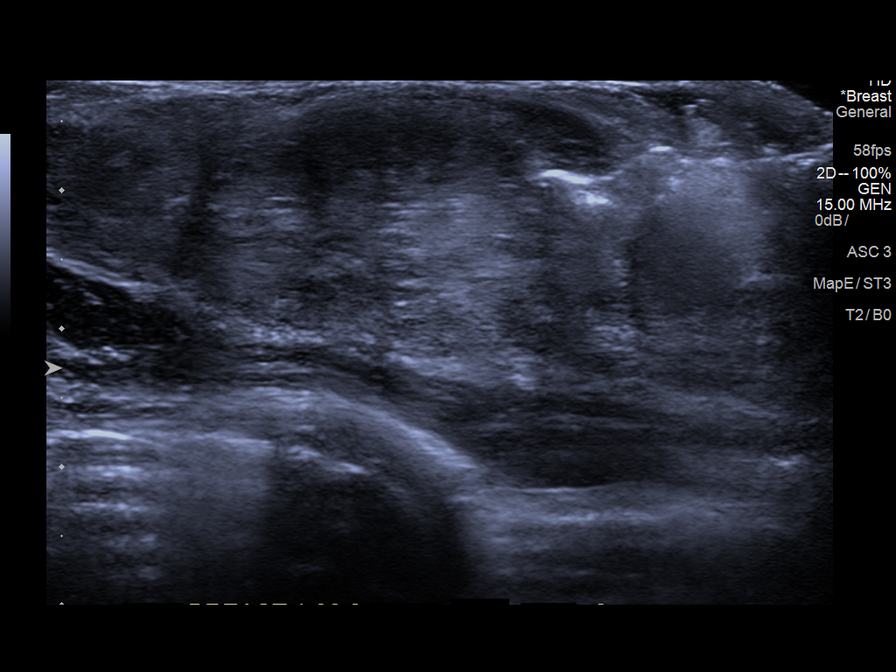
[im 7/10]
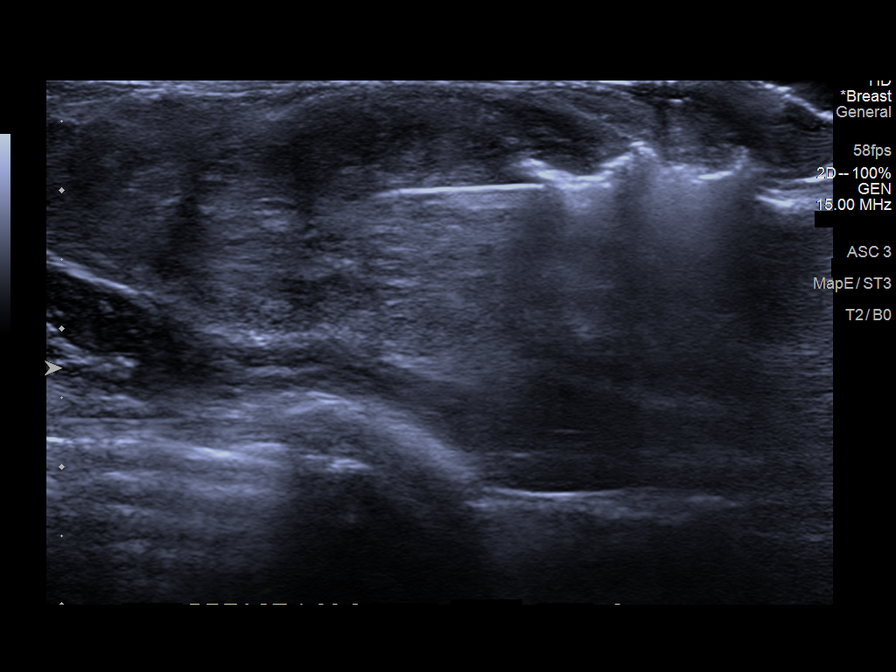
[im 8/10]
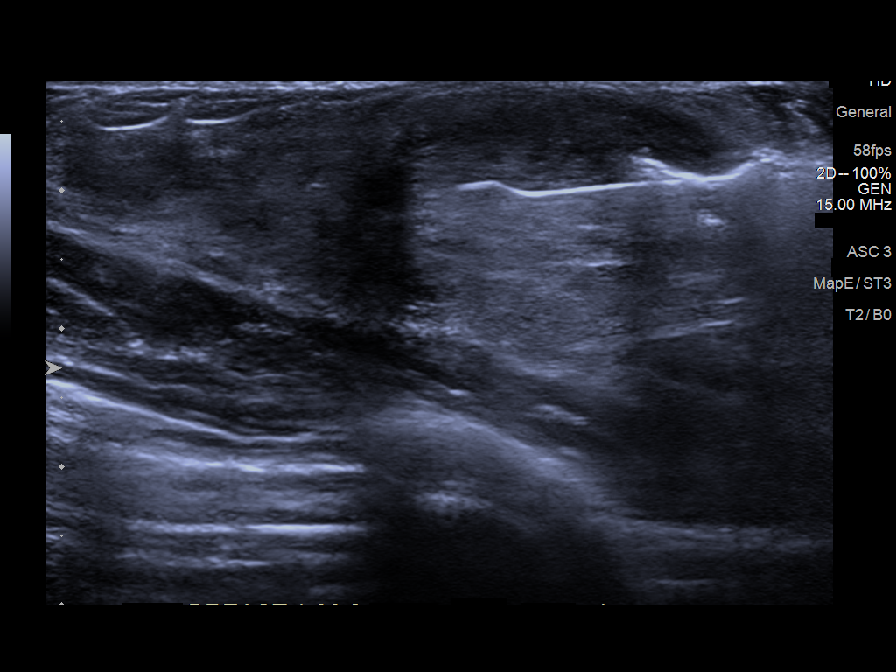
[im 10/10]
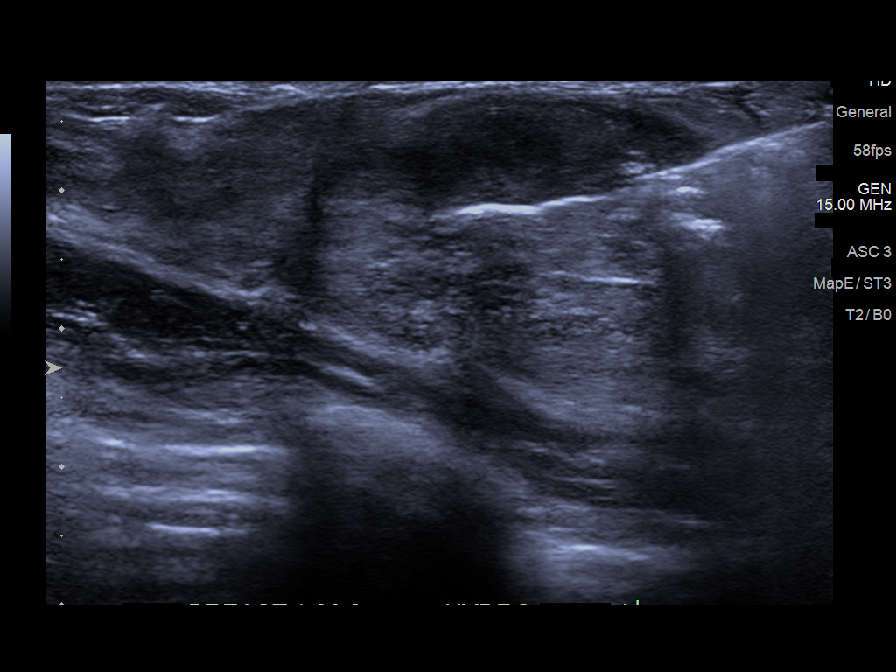

[8 of 8 positions shown; findings below may reference images not displayed]



Lesion quadrant: Upper-outer quadrant

Using sterile technique and 1% Lidocaine as local anesthetic, under
direct ultrasound visualization, a 14 gauge Honeylou device was
used to perform biopsy of a mass in the left breast at 1 o'clock
using an inferior approach. At the conclusion of the procedure
HydroMARK tissue marker clip was deployed into the biopsy cavity.
IMPRESSION: Ultrasound guided biopsy of a mass in the left breast at 1 o'clock.
No apparent complications.

ADDENDUM:
PATHOLOGY revealed: A. LEFT BREAST, [DATE] [id number]; ULTRASOUND-GUIDED
BIOPSY: - FIBROADENOMA. - NEGATIVE FOR ATYPIA AND MALIGNANCY.

Pathology results are CONCORDANT with imaging findings, per Dr.
Kaki Jim.

Pathology results and recommendations below were discussed with
patient by telephone on 05/13/2019. Patient reported biopsy site doing
well with slight tenderness at the site. Post biopsy care
instructions were reviewed and questions were answered. Patient was
instructed to call [HOSPITAL] if any concerns or
questions arise related to the biopsy.

Patient called back on 05/14/2019 concerned with "small rectangular
shaped bruise (~ 1 cm) on opposite side of biopsied area. No pain,
swelling or spreading of bruise noted." Patient was instructed to
continue monitor of bruised area and to call [HOSPITAL]
if area worsens or questions arise.

Recommendation: Patient was instructed to continue with monthly self
breast examinations, and clinical follow-up with provider as needed.

Addendum by Talina Phung RN on 05/17/2019.



Lesion quadrant: Upper-outer quadrant

Using sterile technique and 1% Lidocaine as local anesthetic, under
direct ultrasound visualization, a 14 gauge Honeylou device was
used to perform biopsy of a mass in the left breast at 1 o'clock
using an inferior approach. At the conclusion of the procedure
HydroMARK tissue marker clip was deployed into the biopsy cavity.
IMPRESSION: Ultrasound guided biopsy of a mass in the left breast at 1 o'clock.
No apparent complications.

## 2022-09-16 IMAGING — US US BREAST*L* LIMITED INC AXILLA
2 series · 12 of 12 positions shown · non-contrast
Comparison: Previous exam(s).

CLINICAL DATA: The patient had a left breast mass biopsy which
demonstrated a fibroadenoma. The patient feels the mass is
continuing to grow.

EXAM:
ULTRASOUND OF THE LEFT BREAST

[Series 1: us breast*left* limited inc axilla · 0.06mm/px · 8 of 8 slices shown (1 of 2)]
[im 1/8]
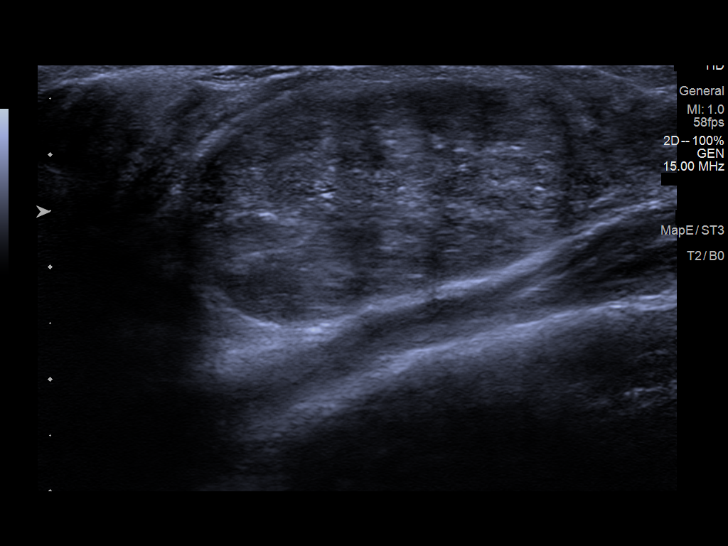
[im 2/8]
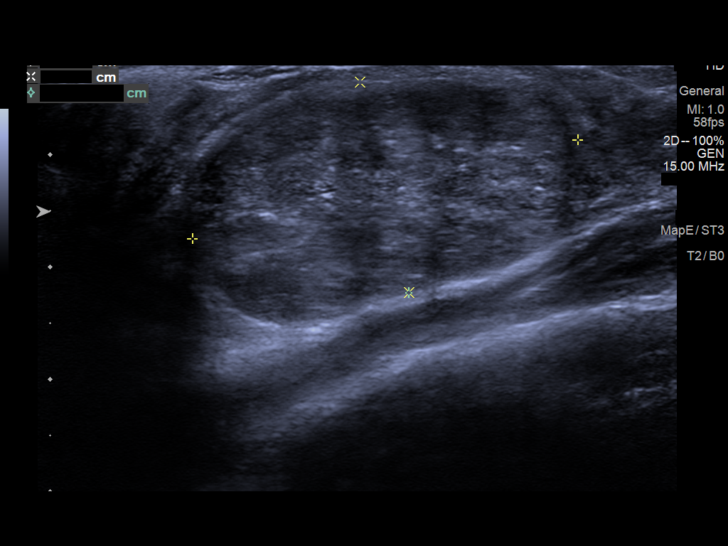
[im 3/8]
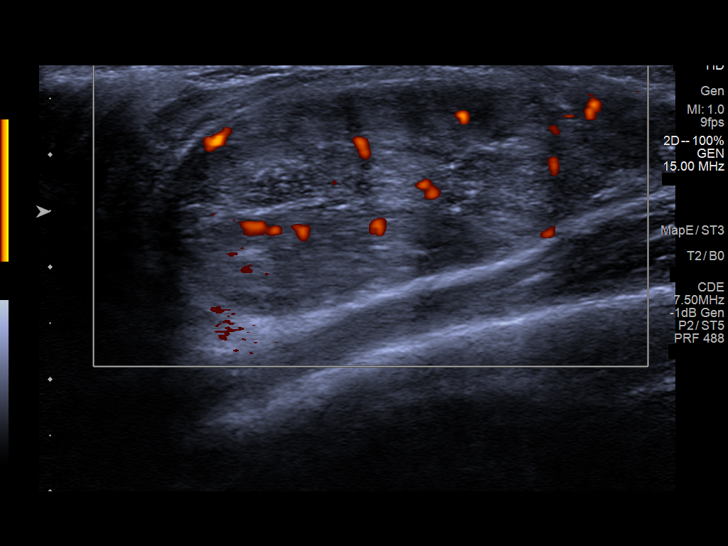
[im 4/8]
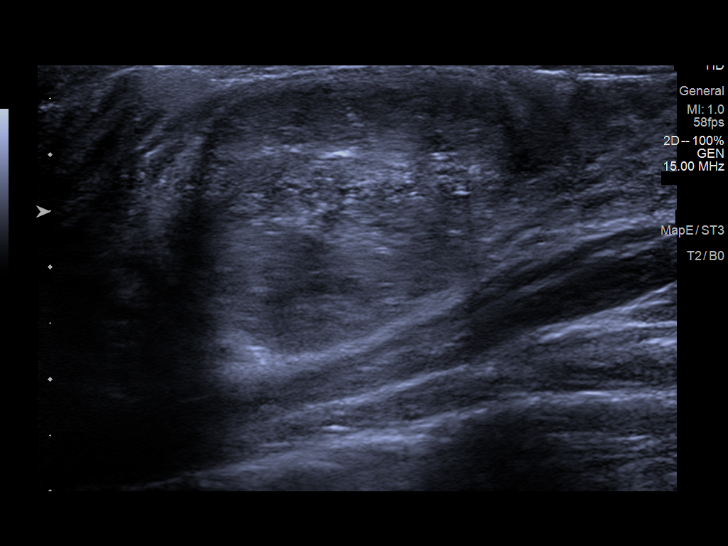
[im 5/8]
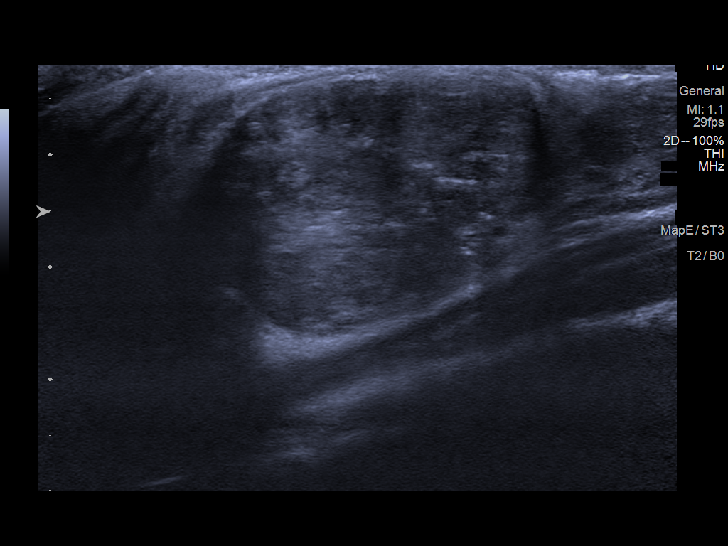
[im 6/8]
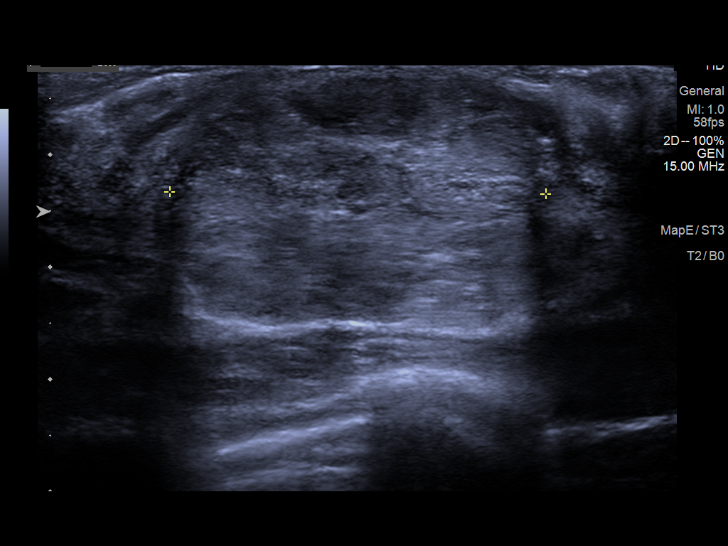
[im 7/8]
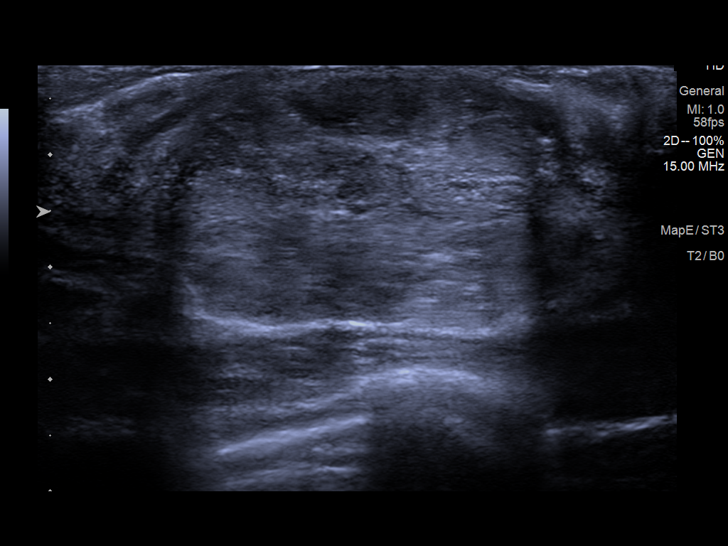
[im 8/8]
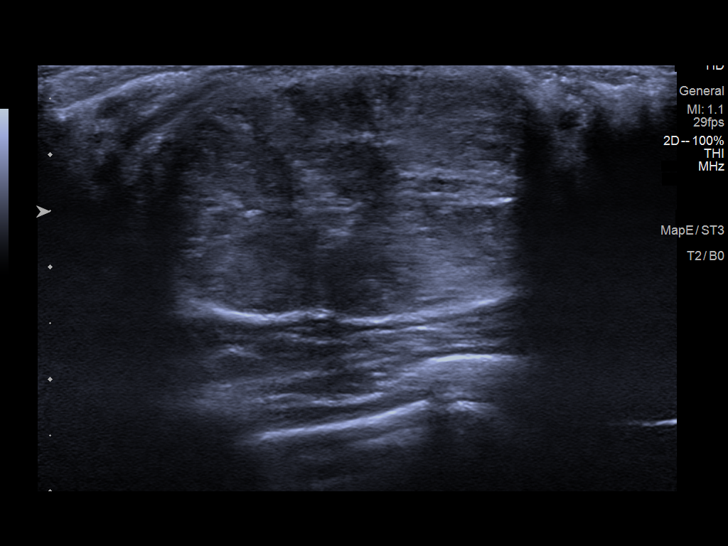

[Series 2: us breast*left* limited inc axilla · 0.07mm/px · 4 of 4 slices shown (2 of 2)]
[im 1/4]
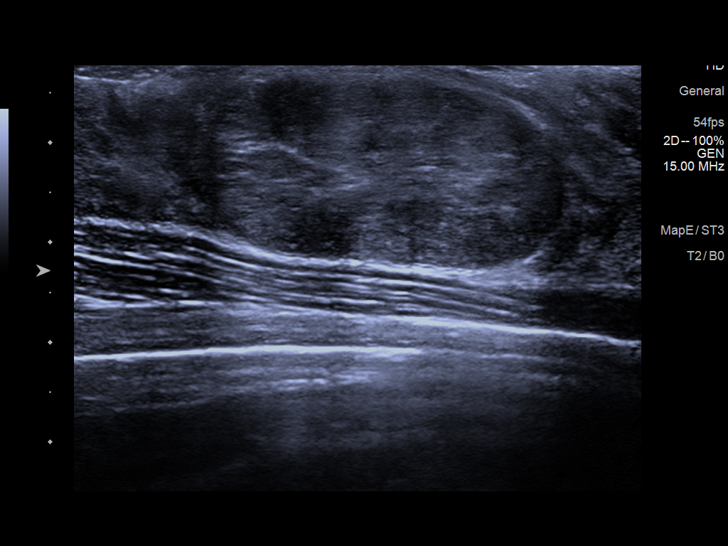
[im 2/4]
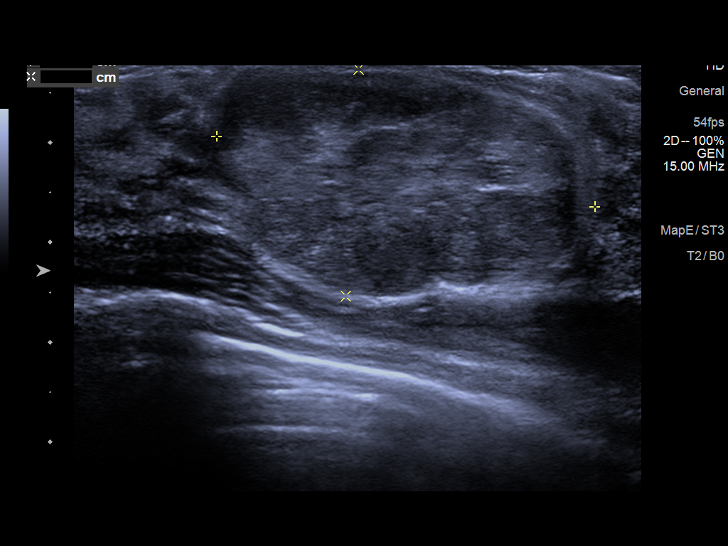
[im 3/4]
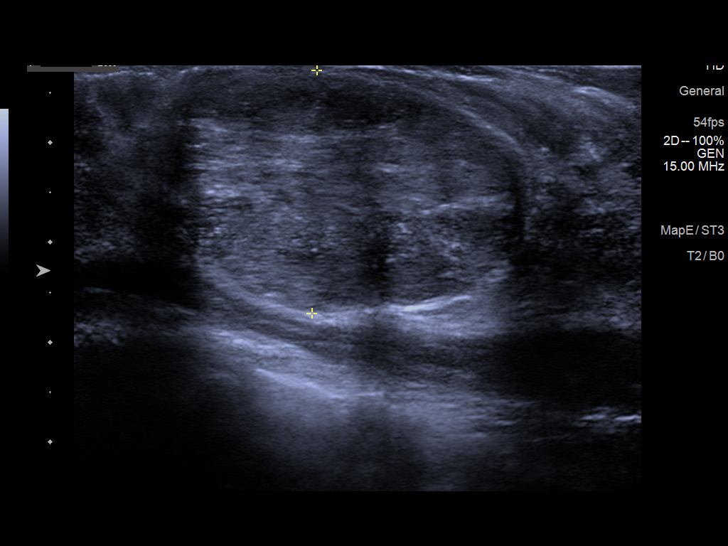
[im 4/4]
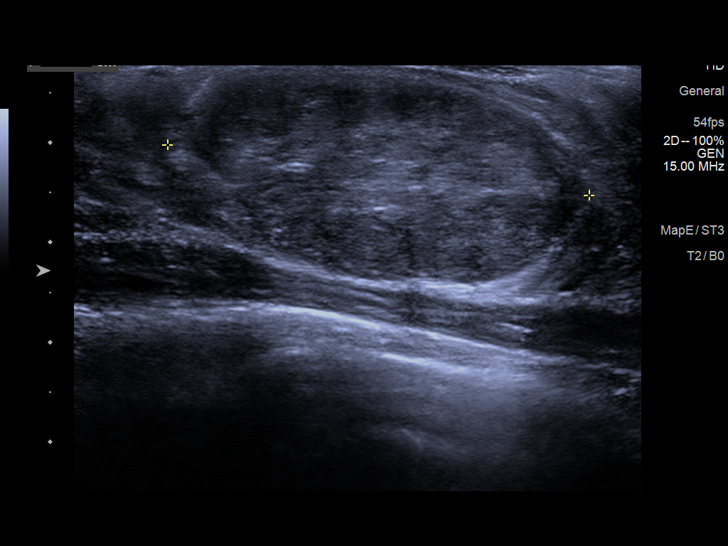

[12 of 12 positions shown; findings below may reference images not displayed]

FINDINGS: On physical exam, a palpable lump is felt in the left breast at 1
o'clock.

Targeted ultrasound is performed, showing a mass in the left breast
at 1 o'clock, 2 cm from the nipple measuring 3.9 by 2.4 by 4.3 cm.
This compares to 3.5 x 1.8 x 3.8 cm in Friday April, 2019 with greater
than 20% volume growth.
IMPRESSION: The left breast mass continues to grow and is increased greater than
20% in volume since Friday April, 2019.

RECOMMENDATION:
Recommend surgical excision of the left breast mass.

I have discussed the findings and recommendations with the patient.
If applicable, a reminder letter will be sent to the patient
regarding the next appointment.

BI-RADS CATEGORY  2: Benign.
